# Patient Record
Sex: Female | Born: 1985 | Race: White | Hispanic: No | Marital: Married | Smoking: Never smoker
Health system: Southern US, Community
[De-identification: ages and names within clinical notes are randomized; demographics above are authoritative.]

## PROBLEM LIST (undated history)

## (undated) DIAGNOSIS — G4700 Insomnia, unspecified: Secondary | ICD-10-CM

## (undated) DIAGNOSIS — K219 Gastro-esophageal reflux disease without esophagitis: Secondary | ICD-10-CM

## (undated) DIAGNOSIS — N200 Calculus of kidney: Secondary | ICD-10-CM

## (undated) DIAGNOSIS — R87619 Unspecified abnormal cytological findings in specimens from cervix uteri: Secondary | ICD-10-CM

## (undated) HISTORY — PX: OTHER SURGICAL HISTORY: SHX169

## (undated) HISTORY — DX: Insomnia, unspecified: G47.00

## (undated) HISTORY — DX: Gastro-esophageal reflux disease without esophagitis: K21.9

## (undated) HISTORY — PX: TONSILLECTOMY: SUR1361

## (undated) HISTORY — DX: Calculus of kidney: N20.0

## (undated) HISTORY — DX: Unspecified abnormal cytological findings in specimens from cervix uteri: R87.619

---

## 2005-03-21 ENCOUNTER — Emergency Department (HOSPITAL_COMMUNITY): Admission: EM | Admit: 2005-03-21 | Discharge: 2005-03-21 | Payer: Self-pay | Admitting: Family Medicine

## 2010-06-12 ENCOUNTER — Emergency Department (HOSPITAL_COMMUNITY)
Admission: EM | Admit: 2010-06-12 | Discharge: 2010-06-13 | Disposition: A | Discharge status: Home / Self Care | Payer: BC Managed Care – PPO | Attending: Emergency Medicine | Admitting: Emergency Medicine

## 2010-06-12 DIAGNOSIS — N1330 Unspecified hydronephrosis: Secondary | ICD-10-CM | POA: Insufficient documentation

## 2010-06-12 DIAGNOSIS — N12 Tubulo-interstitial nephritis, not specified as acute or chronic: Secondary | ICD-10-CM | POA: Insufficient documentation

## 2010-06-12 DIAGNOSIS — R1110 Vomiting, unspecified: Secondary | ICD-10-CM | POA: Insufficient documentation

## 2010-06-12 DIAGNOSIS — R109 Unspecified abdominal pain: Secondary | ICD-10-CM | POA: Insufficient documentation

## 2010-06-12 DIAGNOSIS — N201 Calculus of ureter: Secondary | ICD-10-CM | POA: Insufficient documentation

## 2010-06-12 LAB — URINALYSIS, ROUTINE W REFLEX MICROSCOPIC
Nitrite: POSITIVE — AB
Protein, ur: NEGATIVE mg/dL
Urobilinogen, UA: 1 mg/dL (ref 0.0–1.0)

## 2010-06-12 LAB — URINE MICROSCOPIC-ADD ON

## 2010-06-13 ENCOUNTER — Emergency Department (HOSPITAL_COMMUNITY): Payer: BC Managed Care – PPO

## 2010-06-13 LAB — CBC
HCT: 39.8 % (ref 36.0–46.0)
MCV: 86.7 fL (ref 78.0–100.0)
RBC: 4.59 MIL/uL (ref 3.87–5.11)
RDW: 12.8 % (ref 11.5–15.5)
WBC: 10.3 10*3/uL (ref 4.0–10.5)

## 2010-06-13 LAB — DIFFERENTIAL
Eosinophils Relative: 0 % (ref 0–5)
Lymphocytes Relative: 11 % — ABNORMAL LOW (ref 12–46)
Lymphs Abs: 1.2 10*3/uL (ref 0.7–4.0)

## 2010-06-13 LAB — BASIC METABOLIC PANEL
BUN: 15 mg/dL (ref 6–23)
Chloride: 103 mEq/L (ref 96–112)
Glucose, Bld: 117 mg/dL — ABNORMAL HIGH (ref 70–99)
Potassium: 3.8 mEq/L (ref 3.5–5.1)

## 2010-06-14 LAB — URINE CULTURE
Colony Count: 25000
Culture  Setup Time: 201205261139

## 2010-11-16 LAB — HEPATIC FUNCTION PANEL: Bilirubin, Total: 0.4 mg/dL

## 2010-11-16 LAB — BASIC METABOLIC PANEL: Glucose: 76 mg/dL

## 2010-11-16 LAB — TSH: TSH: 2.25 u[IU]/mL (ref 0.41–5.90)

## 2010-11-25 ENCOUNTER — Other Ambulatory Visit: Payer: Self-pay | Admitting: Internal Medicine

## 2010-12-17 ENCOUNTER — Ambulatory Visit
Admission: RE | Admit: 2010-12-17 | Discharge: 2010-12-17 | Disposition: A | Discharge status: Home / Self Care | Payer: BC Managed Care – PPO | Source: Ambulatory Visit | Attending: Internal Medicine | Admitting: Internal Medicine

## 2011-02-08 ENCOUNTER — Encounter: Payer: Self-pay | Admitting: Family Medicine

## 2011-02-08 ENCOUNTER — Ambulatory Visit (INDEPENDENT_AMBULATORY_CARE_PROVIDER_SITE_OTHER): Payer: BC Managed Care – PPO

## 2011-02-08 DIAGNOSIS — G4700 Insomnia, unspecified: Secondary | ICD-10-CM | POA: Insufficient documentation

## 2011-02-08 DIAGNOSIS — N390 Urinary tract infection, site not specified: Secondary | ICD-10-CM

## 2011-04-27 ENCOUNTER — Ambulatory Visit (INDEPENDENT_AMBULATORY_CARE_PROVIDER_SITE_OTHER): Payer: BC Managed Care – PPO | Admitting: Physician Assistant

## 2011-04-27 VITALS — BP 122/74 | HR 93 | Temp 98.2°F | Resp 20 | Ht 60.0 in | Wt 138.0 lb

## 2011-04-27 DIAGNOSIS — R300 Dysuria: Secondary | ICD-10-CM

## 2011-04-27 DIAGNOSIS — N390 Urinary tract infection, site not specified: Secondary | ICD-10-CM

## 2011-04-27 LAB — POCT URINALYSIS DIPSTICK
Blood, UA: NEGATIVE
Glucose, UA: 250
Nitrite, UA: POSITIVE
Urobilinogen, UA: >=8

## 2011-04-27 LAB — POCT UA - MICROSCOPIC ONLY: Crystals, Ur, HPF, POC: NEGATIVE

## 2011-04-27 MED ORDER — PHENAZOPYRIDINE HCL 200 MG PO TABS: 200.0000 mg | ORAL_TABLET | Freq: Three times a day (TID) | ORAL | Status: DC | PRN

## 2011-04-27 MED ORDER — NITROFURANTOIN MONOHYD MACRO 100 MG PO CAPS: 100.0000 mg | ORAL_CAPSULE | Freq: Two times a day (BID) | ORAL | Status: AC

## 2011-04-27 NOTE — Progress Notes (Signed)
  Subjective:    Patient ID: Taylor Carey, female    DOB: 1985/05/08, 26 y.o.   MRN: [id number]  HPI Pt presents to clinic with 48h h/o dysuria and hematuria.  Was bad on Sunday then yesterday was a little improved.  Today return of symptoms.  Abd discomfort and slight nausea but no fever or back pain.  She started using pyridium and that helps.  She has h/o UTI esp after sexually intercourse.  She has no vaginal symptoms and no change in sex partners.     Review of Systems  Constitutional: Negative for fever and chills.  Gastrointestinal: Positive for nausea (mild) and abdominal pain (suprapubic TTP).  Genitourinary: Positive for dysuria, urgency, frequency and hematuria. Negative for vaginal bleeding, vaginal discharge and vaginal pain.       Objective:   Physical Exam  Constitutional: She appears well-developed and well-nourished.  HENT:  Head: Normocephalic and atraumatic.  Right Ear: External ear normal.  Left Ear: External ear normal.  Cardiovascular: Normal rate, regular rhythm and normal heart sounds.   No murmur heard. Pulmonary/Chest: Effort normal and breath sounds normal.  Abdominal: Soft. There is tenderness (suprapubic TTP). There is no CVA tenderness.  Skin: Skin is warm and dry.  Psychiatric: She has a normal mood and affect. Her behavior is normal. Judgment and thought content normal.      Results for orders placed in visit on 04/27/11  POCT URINALYSIS DIPSTICK      Component Value Range   Color, UA orange     Clarity, UA cloudy     Glucose, UA 250     Bilirubin, UA mod     Ketones, UA 15     Spec Grav, UA <=1.005     Blood, UA neg     pH, UA 5.0     Protein, UA 300     Urobilinogen, UA >=8.0     Nitrite, UA pos     Leukocytes, UA large (3+)    POCT UA - MICROSCOPIC ONLY      Component Value Range   WBC, Ur, HPF, POC 8-10     RBC, urine, microscopic 1-2     Bacteria, U Microscopic 2+     Mucus, UA neg     Epithelial cells, urine per micros 0-3       Crystals, Ur, HPF, POC neg     Casts, Ur, LPF, POC neg     Yeast, UA neg         Assessment & Plan:   1. Dysuria  POCT urinalysis dipstick, POCT UA - Microscopic Only, Urine culture  2. Urinary tract infection, site not specified  Urine culture, nitrofurantoin, macrocrystal-monohydrate, (MACROBID) 100 MG capsule   Pt to push fluids,  Take abx as directed.  F/u if continued problems.  Answered questions for patient.

## 2011-04-28 ENCOUNTER — Ambulatory Visit
Admission: RE | Admit: 2011-04-28 | Discharge: 2011-04-28 | Disposition: A | Discharge status: Home / Self Care | Payer: BC Managed Care – PPO | Source: Ambulatory Visit | Attending: Family Medicine | Admitting: Family Medicine

## 2011-04-28 ENCOUNTER — Telehealth: Payer: Self-pay

## 2011-04-28 ENCOUNTER — Ambulatory Visit (INDEPENDENT_AMBULATORY_CARE_PROVIDER_SITE_OTHER): Payer: BC Managed Care – PPO | Admitting: Family Medicine

## 2011-04-28 VITALS — BP 108/66 | HR 112 | Temp 98.5°F | Resp 16 | Ht 60.0 in | Wt 138.0 lb

## 2011-04-28 DIAGNOSIS — R109 Unspecified abdominal pain: Secondary | ICD-10-CM

## 2011-04-28 DIAGNOSIS — R300 Dysuria: Secondary | ICD-10-CM

## 2011-04-28 DIAGNOSIS — IMO0001 Reserved for inherently not codable concepts without codable children: Secondary | ICD-10-CM

## 2011-04-28 DIAGNOSIS — N390 Urinary tract infection, site not specified: Secondary | ICD-10-CM

## 2011-04-28 DIAGNOSIS — R5381 Other malaise: Secondary | ICD-10-CM

## 2011-04-28 DIAGNOSIS — R81 Glycosuria: Secondary | ICD-10-CM

## 2011-04-28 LAB — POCT CBC
HCT, POC: 37.8 % (ref 37.7–47.9)
Lymph, poc: 0.8 (ref 0.6–3.4)
MCH, POC: 28.1 pg (ref 27–31.2)
MCHC: 33.1 g/dL (ref 31.8–35.4)
MCV: 84.9 fL (ref 80–97)
MID (cbc): 0.7 (ref 0–0.9)
MPV: 9.8 fL (ref 0–99.8)
POC LYMPH PERCENT: 5.3 %L — AB (ref 10–50)
WBC: 14.5 10*3/uL — AB (ref 4.6–10.2)

## 2011-04-28 LAB — POCT URINALYSIS DIPSTICK
Protein, UA: 100
Spec Grav, UA: <=1.005
Urobilinogen, UA: 4
pH, UA: 5

## 2011-04-28 LAB — POCT WET PREP WITH KOH
KOH Prep POC: NEGATIVE
Trichomonas, UA: NEGATIVE
Yeast Wet Prep HPF POC: NEGATIVE

## 2011-04-28 LAB — POCT URINE PREGNANCY: Preg Test, Ur: NEGATIVE

## 2011-04-28 LAB — POCT UA - MICROSCOPIC ONLY
Casts, Ur, LPF, POC: NEGATIVE
Crystals, Ur, HPF, POC: NEGATIVE
Yeast, UA: NEGATIVE

## 2011-04-28 LAB — POCT INFLUENZA A/B
Influenza A, POC: NEGATIVE
Influenza B, POC: NEGATIVE

## 2011-04-28 LAB — GLUCOSE, POCT (MANUAL RESULT ENTRY): POC Glucose: 80

## 2011-04-28 MED ORDER — IOHEXOL 300 MG/ML  SOLN
100.0000 mL | Freq: Once | INTRAMUSCULAR | Status: AC | PRN
  Administered 2011-04-28: 100 mL via INTRAVENOUS

## 2011-04-28 MED ORDER — CEFTRIAXONE SODIUM 1 G IJ SOLR
1.0000 g | INTRAMUSCULAR | Status: DC
  Administered 2011-04-28: 1 g via INTRAMUSCULAR

## 2011-04-28 MED ORDER — HYDROCODONE-ACETAMINOPHEN 5-500 MG PO TABS: 1.0000 | ORAL_TABLET | Freq: Three times a day (TID) | ORAL | Status: AC | PRN

## 2011-04-28 NOTE — Telephone Encounter (Signed)
PT STATES DR COPLAND SENT HER OVER TO [facility] AND SHE WOULD LIKE TO SPEAK WITH SOMEONE TO LET THEM KNOW WHAT HAPPENED PLEASE CALL [phone]

## 2011-04-28 NOTE — Telephone Encounter (Signed)
Pt came in to see Dr. Patsy Lager

## 2011-04-28 NOTE — Progress Notes (Signed)
Patient Name: Taylor Carey Date of Birth: February 22, 1985 Medical Record Number: [id number] Gender: female Date of Encounter: 04/28/2011  History of Present Illness:  Taylor Carey is a 26 y.o. very pleasant female patient who presents with the following:  Here yesterday and started on macrobid for UTI symptoms; urinary frequency, burning with urination.  She noted bad lower back pain, nausea, felt feverish, cough, and headache last night.  Did not check her temperature.    She also notes a mild ST with cough, no earache.  Did have body aches, no chills. Positive for fatigue.  Taylor Carey states that the cough was worse last night but better now.   No vomiting, no diarrhea, no abdominal pain of any significance- she admits to some RLQ tenderness at times.   A little vaginal D/C.  Dysuria seems a little bit better now.  Did not eat yet this morning.   She has an OBG- she had her last pap in march of 2012.  She has been with her current BF for about one year and has had gonorrhea/ chlamydia testing in the last year- no other partner.  She does not suspect risk for STI.   Surgical history positive for T and A only LMP 04/18/11 Took excedrin migraine this morning She has not yet eaten today but feels that she might like to eat  She did have an obstructing kidney stone last year and hydronephrosis. This resolved but she had a CT scan then for diagnosis.  Patient Active Problem List  Diagnoses  . Insomnia   Past Medical History  Diagnosis Date  . Insomnia    No past surgical history on file. History  Substance Use Topics  . Smoking status: Current Some Day Smoker    Types: Cigarettes  . Smokeless tobacco: Not on file  . Alcohol Use: 1.0 oz/week    2 drink(s) per week   No family history on file. No Known Allergies  Medication list has been reviewed and updated.  Review of Systems: As per HPI- otherwise negative.   Physical Examination: Filed Vitals:   04/28/11 0842  BP:  108/66  Pulse: 112  Temp: 98.5 F (36.9 C)  TempSrc: Oral  Resp: 16  Height: 5' (1.524 m)  Weight: 138 lb (62.596 kg)    Body mass index is 26.95 kg/(m^2). GEN: WDWN, NAD, Non-toxic, A & O x 3 HEENT: Atraumatic, Normocephalic. Neck supple. No masses, No LAD.  Tm, oropharynx wnl, PEERL Ears and Nose: No external deformity. CV: RRR, No M/G/R. No JVD. No thrill. No extra heart sounds. PULM: CTA B, no wheezes, crackles, rhonchi. No retractions. No resp. distress. No accessory muscle use. ABD: S, ND, +BS. No HSM.  There is RLQ tenderness, guarding and some RLQ rebound.   GU: normal external genitalia, no CMT, no cervical discharge.  No adnexal masses- some tenderness RLQ EXTR: No c/c/e NEURO Normal gait.  PSYCH: Normally interactive. Conversant. Not depressed or anxious appearing.  Calm demeanor.   Results for orders placed in visit on 04/28/11  POCT URINALYSIS DIPSTICK      Component Value Range   Color, UA orange     Clarity, UA hazy     Glucose, UA 100     Bilirubin, UA small     Ketones, UA trace     Spec Grav, UA <=1.005     Blood, UA neg     pH, UA 5.0     Protein, UA 100     Urobilinogen, UA 4.0  Nitrite, UA pos     Leukocytes, UA large (3+)    POCT UA - MICROSCOPIC ONLY      Component Value Range   WBC, Ur, HPF, POC 1-2     RBC, urine, microscopic 0-2     Bacteria, U Microscopic neg     Mucus, UA neg     Epithelial cells, urine per micros 2-5     Crystals, Ur, HPF, POC neg     Casts, Ur, LPF, POC neg     Yeast, UA neg    POCT WET PREP WITH KOH      Component Value Range   Trichomonas, UA Negative     Clue Cells Wet Prep HPF POC 1-3     Epithelial Wet Prep HPF POC 4-8     Yeast Wet Prep HPF POC negative     Bacteria Wet Prep HPF POC 2+     RBC Wet Prep HPF POC negative     WBC Wet Prep HPF POC 2-4     KOH Prep POC Negative    POCT CBC      Component Value Range   WBC 14.5 (*) 4.6 - 10.2 (K/uL)   Lymph, poc 0.8  0.6 - 3.4    POC LYMPH PERCENT 5.3 (*)  10 - 50 (%L)   MID (cbc) 0.7  0 - 0.9    POC MID % 4.7  0 - 12 (%M)   POC Granulocyte 13.1 (*) 2 - 6.9    Granulocyte percent 90.0 (*) 37 - 80 (%G)   RBC 4.45  4.04 - 5.48 (M/uL)   Hemoglobin 12.5  12.2 - 16.2 (g/dL)   HCT, POC 08.6  57.8 - 47.9 (%)   MCV 84.9  80 - 97 (fL)   MCH, POC 28.1  27 - 31.2 (pg)   MCHC 33.1  31.8 - 35.4 (g/dL)   RDW, POC 46.9     Platelet Count, POC 239  142 - 424 (K/uL)   MPV 9.8  0 - 99.8 (fL)  POCT URINE PREGNANCY      Component Value Range   Preg Test, Ur Negative    POCT INFLUENZA A/B      Component Value Range   Influenza A, POC Negative     Influenza B, POC Negative    GLUCOSE, POCT (MANUAL RESULT ENTRY)      Component Value Range   POC Glucose 80     Suspect glucose in urine is actually false- due to pyridium use  Comparison: CT of abdomen and pelvis 06/13/2010.  Findings:  Lung Bases: Unremarkable.  Abdomen/Pelvis: The enhanced appearance of the liver, gallbladder, pancreas, spleen, bilateral adrenal glands and bilateral kidneys is unremarkable. There is no ascites or pneumoperitoneum and no pathologic distension of bowel. No definite pathologic adenopathy identified within the abdomen or pelvis. Uterus, bilateral ovaries and urinary bladder are unremarkable in appearance. The appendix is normal in appearance.  Musculoskeletal: There are no aggressive appearing lytic or blastic lesions noted in the visualized portions of the skeleton.  IMPRESSION: 1. No acute findings in the abdomen or pelvis to account for the patient's symptoms. Specifically, the appendix is normal.   Assessment and Plan: 1. UTI (lower urinary tract infection)  POCT urine pregnancy, cefTRIAXone (ROCEPHIN) injection 1 g  2. Flank pain  Urinalysis Dipstick, POCT UA - Microscopic Only, POCT Wet Prep with KOH, POCT CBC, HYDROcodone-acetaminophen (VICODIN) 5-500 MG per tablet  3. Malaise  Influenza A/B  4. Abdominal  pain,  other specified site  CT Abdomen Pelvis W  Contrast  5. Glycosuria  POCT glucose (manual entry)    Taylor Carey is here today to follow- up a UTI.  However, her symptoms today and elevated wbc count are concerning for appendicitis.  Discussed options in detail with Taylor Carey- we do not want to do any more CT scans than absolutely necessary, especially as she had one last year.  However, my concern for appendicitis is fairly high.  Offered the option to recheck in 24 hours- however Taylor Carey did decide to go ahead with the scan- results were negative as above.   Suspect pyelonephritis causing symptoms today.  Will give a shot of rocephin today and await urine culture which should be back by tomorrow.  Also gave vicodin for her back pain- this may be MSK and unrelated to other symptoms.  Let us know sooner if worse!  Addnd: called patient 04/29/11- urine culture shows e. Coli.  Add Cipro 500 BID for 7 days- called in to her pharmacy.  Will look for sensitivity tomorrow.  For now may continue macrobid and cipro.

## 2011-04-29 MED ORDER — CIPROFLOXACIN HCL 500 MG PO TABS: 500.0000 mg | ORAL_TABLET | Freq: Two times a day (BID) | ORAL | Status: AC

## 2011-04-29 NOTE — Progress Notes (Signed)
Called to check on her today- doing ok, feels a little better.  ucx still not back- will continue to look for this today and hope to have result by this pm

## 2011-04-30 ENCOUNTER — Telehealth: Payer: Self-pay | Admitting: *Deleted

## 2011-04-30 NOTE — Telephone Encounter (Signed)
Per dr copland,  pt's urine cx sensitivity is not back yet.  md would like [place] to check status.  Left message on machine for pt to call back.

## 2011-05-01 LAB — URINE CULTURE: Colony Count: >=100000

## 2011-05-01 NOTE — Telephone Encounter (Signed)
Pt states that she is doing better.

## 2011-05-01 NOTE — Telephone Encounter (Signed)
Got sensitivity back- e. Coli sensitive to cipro and macrobid.  LMOM- may finish out both macrobid and cipro as she is improving.  Let us know if any complication or if she gets worse.

## 2011-06-08 ENCOUNTER — Telehealth: Payer: Self-pay

## 2011-06-08 NOTE — Telephone Encounter (Signed)
Dr.Copland, Pt states that she would like to discuss her OV from April with you. No further details from the pt she only wants to speak with you*

## 2011-06-10 NOTE — Telephone Encounter (Signed)
[name] was calling to see if we could help with a billing issue from a recent CT scan.  We called [facility]- it seems she had not fulfilled her deductible, and this was the problem.  Called and let her know

## 2011-08-30 ENCOUNTER — Other Ambulatory Visit: Payer: Self-pay | Admitting: Radiology

## 2011-08-30 MED ORDER — LORAZEPAM 1 MG PO TABS: 1.0000 mg | ORAL_TABLET | Freq: Every evening | ORAL | Status: DC | PRN

## 2011-08-30 NOTE — Telephone Encounter (Signed)
Printed to fax to pharmacy 

## 2011-09-29 ENCOUNTER — Other Ambulatory Visit: Payer: Self-pay | Admitting: Family Medicine

## 2011-09-29 MED ORDER — LORAZEPAM 1 MG PO TABS: 1.0000 mg | ORAL_TABLET | Freq: Every evening | ORAL | Status: DC | PRN

## 2011-11-25 ENCOUNTER — Ambulatory Visit: Payer: BC Managed Care – PPO | Admitting: Family Medicine

## 2012-05-24 ENCOUNTER — Ambulatory Visit: Payer: BC Managed Care – PPO | Admitting: Physician Assistant

## 2012-05-24 VITALS — BP 117/74 | HR 105 | Temp 97.9°F | Resp 16 | Ht 60.0 in | Wt 125.0 lb

## 2012-05-24 DIAGNOSIS — N390 Urinary tract infection, site not specified: Secondary | ICD-10-CM

## 2012-05-24 DIAGNOSIS — R300 Dysuria: Secondary | ICD-10-CM

## 2012-05-24 LAB — POCT URINALYSIS DIPSTICK
Bilirubin, UA: NEGATIVE
Blood, UA: NEGATIVE
Glucose, UA: NEGATIVE
Nitrite, UA: POSITIVE
Protein, UA: NEGATIVE
Spec Grav, UA: 1.02
Urobilinogen, UA: 0.2
pH, UA: 6

## 2012-05-24 LAB — POCT UA - MICROSCOPIC ONLY
Casts, Ur, LPF, POC: NEGATIVE
Crystals, Ur, HPF, POC: NEGATIVE
Yeast, UA: NEGATIVE

## 2012-05-24 MED ORDER — PHENAZOPYRIDINE HCL 200 MG PO TABS: 200.0000 mg | ORAL_TABLET | Freq: Three times a day (TID) | ORAL | Status: DC | PRN

## 2012-05-24 MED ORDER — DESONIDE 0.05 % EX CREA: TOPICAL_CREAM | Freq: Two times a day (BID) | CUTANEOUS | Status: DC

## 2012-05-24 MED ORDER — CIPROFLOXACIN HCL 500 MG PO TABS: 500.0000 mg | ORAL_TABLET | Freq: Two times a day (BID) | ORAL | Status: DC

## 2012-05-24 NOTE — Progress Notes (Signed)
Subjective:    Patient ID: Taylor Carey, female    DOB: April 29, 1985, 27 y.o.   MRN: [id number]  HPI 27 year old female presents with acute onset of dysuria, urinary frequency, hematuria, and slight nausea.  Denies fever, chills, vomiting, flank pain, or abdominal pain.  She does have a strong history of frequent UTI's, last episode was 3-4 months ago.  She has a prescription from her GYN to take a pill when she first notices symptoms (does not remember the name of this). She did take 1 tablet last night but it did not help.    Also complains of pruritic rash on bilateral eyelids.  Has been noticing for about 7 months - first started after trying a new eye shadow.  She dc'ed this eye shadow and symptoms did not seem to improve. Does wear a lot of eyeliner and eye makeup.  Admits that if she does not wear make up for a few days, it seems to improve.  Has been using OTC hydrocortisone and apple cider vinegar which does seem to help calm it down.      Review of Systems  Constitutional: Negative for fever and chills.  Eyes: Positive for itching (eyelids).  Gastrointestinal: Positive for nausea. Negative for vomiting and abdominal pain.  Genitourinary: Positive for dysuria and frequency. Negative for flank pain and difficulty urinating.       Objective:   Physical Exam  Constitutional: She is oriented to person, place, and time. She appears well-developed and well-nourished.  HENT:  Head: Normocephalic and atraumatic.  Right Ear: External ear normal.  Left Ear: External ear normal.  Mouth/Throat: Oropharynx is clear and moist.  Eyes: Conjunctivae and lids are normal.  Neck: Normal range of motion.  Cardiovascular: Normal rate, regular rhythm and normal heart sounds.   Pulmonary/Chest: Effort normal and breath sounds normal.  Abdominal: Soft. Bowel sounds are normal. There is no tenderness. There is no rebound, no guarding and no CVA tenderness.  Neurological: She is alert and oriented to  person, place, and time.  Psychiatric: She has a normal mood and affect. Her behavior is normal. Judgment and thought content normal.     Results for orders placed in visit on 05/24/12  POCT URINALYSIS DIPSTICK      Result Value Range   Color, UA amber     Clarity, UA hazy     Glucose, UA neg     Bilirubin, UA neg     Ketones, UA trace     Spec Grav, UA 1.020     Blood, UA neg     pH, UA 6.0     Protein, UA neg     Urobilinogen, UA 0.2     Nitrite, UA positive     Leukocytes, UA Trace    POCT UA - MICROSCOPIC ONLY      Result Value Range   WBC, Ur, HPF, POC 8-12     RBC, urine, microscopic 1-3     Bacteria, U Microscopic trace     Mucus, UA trace     Epithelial cells, urine per micros 1-3     Crystals, Ur, HPF, POC neg     Casts, Ur, LPF, POC neg     Yeast, UA neg          Assessment & Plan:  UTI (urinary tract infection)  Dysuria - Plan: POCT urinalysis dipstick, POCT UA - Microscopic Only, desonide (DESOWEN) 0.05 % cream, ciprofloxacin (CIPRO) 500 MG tablet, Urine culture, phenazopyridine (PYRIDIUM) 200  MG tablet  Start Cipro 500 mg bid x 5 days Urine culture sent Pyridium 200 mg tid prn dysuria and frequency Increase fluids and rest For eyelid symptoms, recommend changing eye makeup brands - likely allergic etiology. Patient unwilling to dc makeup altogether Follow up if symptoms worsen or fail to improve.

## 2012-05-25 ENCOUNTER — Encounter: Payer: Self-pay | Admitting: Certified Nurse Midwife

## 2012-05-26 ENCOUNTER — Ambulatory Visit (INDEPENDENT_AMBULATORY_CARE_PROVIDER_SITE_OTHER): Payer: BC Managed Care – PPO | Admitting: Certified Nurse Midwife

## 2012-05-26 ENCOUNTER — Encounter: Payer: Self-pay | Admitting: Certified Nurse Midwife

## 2012-05-26 VITALS — BP 90/64 | Ht 60.25 in | Wt 127.0 lb

## 2012-05-26 DIAGNOSIS — IMO0001 Reserved for inherently not codable concepts without codable children: Secondary | ICD-10-CM

## 2012-05-26 DIAGNOSIS — Z01419 Encounter for gynecological examination (general) (routine) without abnormal findings: Secondary | ICD-10-CM

## 2012-05-26 DIAGNOSIS — Z23 Encounter for immunization: Secondary | ICD-10-CM

## 2012-05-26 DIAGNOSIS — Z309 Encounter for contraceptive management, unspecified: Secondary | ICD-10-CM

## 2012-05-26 LAB — URINE CULTURE
Colony Count: NO GROWTH
Organism ID, Bacteria: NO GROWTH

## 2012-05-26 MED ORDER — DROSPIRENONE-ETHINYL ESTRADIOL 3-0.02 MG PO TABS
1.0000 | ORAL_TABLET | Freq: Every day | ORAL | Status: DC
Start: 2012-05-26 — End: 2013-06-10

## 2012-05-26 MED ORDER — TETANUS-DIPHTH-ACELL PERTUSSIS 5-2.5-18.5 LF-MCG/0.5 IM SUSP
0.5000 mL | Freq: Once | INTRAMUSCULAR | Status: AC
  Administered 2012-05-26: 0.5 mL via INTRAMUSCULAR

## 2012-05-26 NOTE — Progress Notes (Signed)
27 y.o. G0P0000 Legally Separated Caucasian Fe here for annual exam. Periods normal, no issues.  OCP working well, no problems.  No STD concerns or testing needed. Currently on Cipro for UTI with PCP.  Symptoms much better.  Patient's last menstrual period was 05/14/2012.          Sexually active: yes  The current method of family planning is OCP (estrogen/progesterone).    Exercising: yes  cardio & weights Smoker:  no  Health Maintenance: Pap:  04-09-10 neg MMG:  none Colonoscopy:  none BMD:   none TDaP:  2004  Labs: none   reports that she has never smoked. She does not have any smokeless tobacco history on file. She reports that she drinks about 0.5 ounces of alcohol per week. She reports that she does not use illicit drugs.  Past Medical History  Diagnosis Date  . Insomnia   . Kidney stone     Past Surgical History  Procedure Laterality Date  . Tonsillectomy      Current Outpatient Prescriptions  Medication Sig Dispense Refill  . ciprofloxacin (CIPRO) 500 MG tablet Take 500 mg by mouth 2 (two) times daily.      Marland Kitchen desonide (DESOWEN) 0.05 % cream Apply topically 2 (two) times daily.  30 g  0  . drospirenone-ethinyl estradiol (YAZ,GIANVI,LORYNA) 3-0.02 MG tablet Take 1 tablet by mouth daily.      Marland Kitchen LORazepam (ATIVAN) 1 MG tablet Take 1 tablet (1 mg total) by mouth at bedtime as needed.  30 tablet  0  . phenazopyridine (PYRIDIUM) 200 MG tablet Take 1 tablet (200 mg total) by mouth 3 (three) times daily as needed for pain.  10 tablet  0   No current facility-administered medications for this visit.    Family History  Problem Relation Age of Onset  . Crohn's disease Mother   . Hypertension Mother   . Cancer Father     kidney    ROS:  Pertinent items are noted in HPI.  Otherwise, a comprehensive ROS was negative.  Exam:   BP 90/64  Ht 5' 0.25" (1.53 m)  Wt 127 lb (57.607 kg)  BMI 24.61 kg/m2  LMP 05/14/2012 Height: 5' 0.25" (153 cm)  Ht Readings from Last 3  Encounters:  05/26/12 5' 0.25" (1.53 m)  05/24/12 5' (1.524 m)  04/28/11 5' (1.524 m)    General appearance: alert, cooperative and appears stated age Head: Normocephalic, without obvious abnormality, atraumatic Neck: no adenopathy, supple, symmetrical, trachea midline and thyroid normal to inspection and palpation Lungs: clear to auscultation bilaterally Breasts: normal appearance, no masses or tenderness, No nipple discharge or bleeding Heart: regular rate and rhythm Abdomen: soft, non-tender; no masses,  no organomegaly Extremities: extremities normal, atraumatic, no cyanosis or edema Skin: Skin color, texture, turgor normal. No rashes or lesions Lymph nodes: Cervical, supraclavicular, and axillary nodes normal. No abnormal inguinal nodes palpated Neurologic: Grossly normal   Pelvic: External genitalia:  no lesions              Urethra:  normal appearing urethra with no masses, tenderness or lesions              Bartholin's and Skene's: normal                 Vagina: normal appearing vagina with normal color and discharge, no lesions              Cervix: normal  Pap taken: no Bimanual Exam:  Uterus:  normal size, contour, position, consistency, mobility, non-tender and anteverted              Adnexa: normal adnexa and no mass, fullness, tenderness               Rectovaginal: Confirms               Anus:  normal sphincter tone, no lesions  A:  Well Woman with normal exam  Contraception OCP  UTI currently under treatment with PCP  Immunization update  P:  Reviewed health and wellness pertinent to exam  Pap smear as per guidelines   pap smear not collected today  RX Yaz see order Continue follow up as indicated with UTI  Requests TDAP  counseled on breast self exam, feminine hygiene, adequate intake of calcium and vitamin D return annually or prn  An After Visit Summary was printed and given to the patient.  Reviewed, TL

## 2012-05-26 NOTE — Patient Instructions (Addendum)
General topics  Next pap or exam is  due in 1 year Take a Women's multivitamin Take 1200 mg. of calcium daily - prefer dietary If any concerns in interim to call back  Breast Self-Awareness Practicing breast self-awareness may pick up problems early, prevent significant medical complications, and possibly save your life. By practicing breast self-awareness, you can become familiar with how your breasts look and feel and if your breasts are changing. This allows you to notice changes early. It can also offer you some reassurance that your breast health is good. One way to learn what is normal for your breasts and whether your breasts are changing is to do a breast self-exam. If you find a lump or something that was not present in the past, it is best to contact your caregiver right away. Other findings that should be evaluated by your caregiver include nipple discharge, especially if it is bloody; skin changes or reddening; areas where the skin seems to be pulled in (retracted); or new lumps and bumps. Breast pain is seldom associated with cancer (malignancy), but should also be evaluated by a caregiver. BREAST SELF-EXAM The best time to examine your breasts is 5 7 days after your menstrual period is over.  ExitCare Patient Information 2013 [employer].   Exercise to Stay Healthy Exercise helps you become and stay healthy. EXERCISE IDEAS AND TIPS Choose exercises that:  You enjoy.  Fit into your day. You do not need to exercise really hard to be healthy. You can do exercises at a slow or medium level and stay healthy. You can:  Stretch before and after working out.  Try yoga, Pilates, or tai chi.  Lift weights.  Walk fast, swim, jog, run, climb stairs, bicycle, dance, or rollerskate.  Take aerobic classes. Exercises that burn about 150 calories:  Running 1  miles in 15 minutes.  Playing volleyball for 45 to 60 minutes.  Washing and waxing a car for 45 to 60  minutes.  Playing touch football for 45 minutes.  Walking 1  miles in 35 minutes.  Pushing a stroller 1  miles in 30 minutes.  Playing basketball for 30 minutes.  Raking leaves for 30 minutes.  Bicycling 5 miles in 30 minutes.  Walking 2 miles in 30 minutes.  Dancing for 30 minutes.  Shoveling snow for 15 minutes.  Swimming laps for 20 minutes.  Walking up stairs for 15 minutes.  Bicycling 4 miles in 15 minutes.  Gardening for 30 to 45 minutes.  Jumping rope for 15 minutes.  Washing windows or floors for 45 to 60 minutes. Document Released: 02/06/2010 Document Revised: 03/29/2011 Document Reviewed: 02/06/2010 ExitCare Patient Information 2013 [employer].   Other topics ( that may be useful information):    Sexually Transmitted Disease Sexually transmitted disease (STD) refers to any infection that is passed from person to person during sexual activity. This may happen by way of saliva, semen, blood, vaginal mucus, or urine. Common STDs include:  Gonorrhea.  Chlamydia.  Syphilis.  HIV/AIDS.  Genital herpes.  Hepatitis B and C.  Trichomonas.  Human papillomavirus (HPV).  Pubic lice. CAUSES  An STD may be spread by bacteria, virus, or parasite. A person can get an STD by:  Sexual intercourse with an infected person.  Sharing sex toys with an infected person.  Sharing needles with an infected person.  Having intimate contact with the genitals, mouth, or rectal areas of an infected person. SYMPTOMS  Some people may not have any symptoms, but   they can still pass the infection to others. Different STDs have different symptoms. Symptoms include:  Painful or bloody urination.  Pain in the pelvis, abdomen, vagina, anus, throat, or eyes.  Skin rash, itching, irritation, growths, or sores (lesions). These usually occur in the genital or anal area.  Abnormal vaginal discharge.  Penile discharge in men.  Soft, flesh-colored skin growths in the  genital or anal area.  Fever.  Pain or bleeding during sexual intercourse.  Swollen glands in the groin area.  Yellow skin and eyes (jaundice). This is seen with hepatitis. DIAGNOSIS  To make a diagnosis, your caregiver may:  Take a medical history.  Perform a physical exam.  Take a specimen (culture) to be examined.  Examine a sample of discharge under a microscope.  Perform blood test TREATMENT   Chlamydia, gonorrhea, trichomonas, and syphilis can be cured with antibiotic medicine.  Genital herpes, hepatitis, and HIV can be treated, but not cured, with prescribed medicines. The medicines will lessen the symptoms.  Genital warts from HPV can be treated with medicine or by freezing, burning (electrocautery), or surgery. Warts may come back.  HPV is a virus and cannot be cured with medicine or surgery.However, abnormal areas may be followed very closely by your caregiver and may be removed from the cervix, vagina, or vulva through office procedures or surgery. If your diagnosis is confirmed, your recent sexual partners need treatment. This is true even if they are symptom-free or have a negative culture or evaluation. They should not have sex until their caregiver says it is okay. HOME CARE INSTRUCTIONS  All sexual partners should be informed, tested, and treated for all STDs.  Take your antibiotics as directed. Finish them even if you start to feel better.  Only take over-the-counter or prescription medicines for pain, discomfort, or fever as directed by your caregiver.  Rest.  Eat a balanced diet and drink enough fluids to keep your urine clear or pale yellow.  Do not have sex until treatment is completed and you have followed up with your caregiver. STDs should be checked after treatment.  Keep all follow-up appointments, Pap tests, and blood tests as directed by your caregiver.  Only use latex condoms and water-soluble lubricants during sexual activity. Do not use  petroleum jelly or oils.  Avoid alcohol and illegal drugs.  Get vaccinated for HPV and hepatitis. If you have not received these vaccines in the past, talk to your caregiver about whether one or both might be right for you.  Avoid risky sex practices that can break the skin. The only way to avoid getting an STD is to avoid all sexual activity.Latex condoms and dental dams (for oral sex) will help lessen the risk of getting an STD, but will not completely eliminate the risk. SEEK MEDICAL CARE IF:   You have a fever.  You have any new or worsening symptoms. Document Released: 03/27/2002 Document Revised: 03/29/2011 Document Reviewed: 04/03/2010 ExitCare Patient Information 2013 [employer].    Domestic Abuse You are being battered or abused if someone close to you hits, pushes, or physically hurts you in any way. You also are being abused if you are forced into activities. You are being sexually abused if you are forced to have sexual contact of any kind. You are being emotionally abused if you are made to feel worthless or if you are constantly threatened. It is important to remember that help is available. No one has the right to abuse you. PREVENTION OF FURTHER   ABUSE  Learn the warning signs of danger. This varies with situations but may include: the use of alcohol, threats, isolation from friends and family, or forced sexual contact. Leave if you feel that violence is going to occur.  If you are attacked or beaten, report it to the police so the abuse is documented. You do not have to press charges. The police can protect you while you or the attackers are leaving. Get the officer's name and badge number and a copy of the report.  Find someone you can trust and tell them what is happening to you: your caregiver, a nurse, clergy member, close friend or family member. Feeling ashamed is natural, but remember that you have done nothing wrong. No one deserves abuse. Document Released:  01/02/2000 Document Revised: 03/29/2011 Document Reviewed: 03/12/2010 ExitCare Patient Information 2013 [employer].    How Much is Too Much Alcohol? Drinking too much alcohol can cause injury, accidents, and health problems. These types of problems can include:   Car crashes.  Falls.  Family fighting (domestic violence).  Drowning.  Fights.  Injuries.  Burns.  Damage to certain organs.  Having a baby with birth defects. ONE DRINK CAN BE TOO MUCH WHEN YOU ARE:  Working.  Pregnant or breastfeeding.  Taking medicines. Ask your doctor.  Driving or planning to drive. If you or someone you know has a drinking problem, get help from a doctor.  Document Released: 10/31/2008 Document Revised: 03/29/2011 Document Reviewed: 10/31/2008 ExitCare Patient Information 2013 [employer].   Smoking Hazards Smoking cigarettes is extremely bad for your health. Tobacco smoke has over 200 known poisons in it. There are over 60 chemicals in tobacco smoke that cause cancer. Some of the chemicals found in cigarette smoke include:   Cyanide.  Benzene.  Formaldehyde.  Methanol (wood alcohol).  Acetylene (fuel used in welding torches).  Ammonia. Cigarette smoke also contains the poisonous gases nitrogen oxide and carbon monoxide.  Cigarette smokers have an increased risk of many serious medical problems and Smoking causes approximately:  90% of all lung cancer deaths in men.  80% of all lung cancer deaths in women.  90% of deaths from chronic obstructive lung disease. Compared with nonsmokers, smoking increases the risk of:  Coronary heart disease by 2 to 4 times.  Stroke by 2 to 4 times.  Men developing lung cancer by 23 times.  Women developing lung cancer by 13 times.  Dying from chronic obstructive lung diseases by 12 times.  . Smoking is the most preventable cause of death and disease in our society.  WHY IS SMOKING ADDICTIVE?  Nicotine is the chemical  agent in tobacco that is capable of causing addiction or dependence.  When you smoke and inhale, nicotine is absorbed rapidly into the bloodstream through your lungs. Nicotine absorbed through the lungs is capable of creating a powerful addiction. Both inhaled and non-inhaled nicotine may be addictive.  Addiction studies of cigarettes and spit tobacco show that addiction to nicotine occurs mainly during the teen years, when young people begin using tobacco products. WHAT ARE THE BENEFITS OF QUITTING?  There are many health benefits to quitting smoking.   Likelihood of developing cancer and heart disease decreases. Health improvements are seen almost immediately.  Blood pressure, pulse rate, and breathing patterns start returning to normal soon after quitting. QUITTING SMOKING   American Lung Association - 1-800-LUNGUSA  American Cancer Society - 1-800-ACS-2345 Document Released: 02/12/2004 Document Revised: 03/29/2011 Document Reviewed: 10/16/2008 ExitCare Patient Information 2013 [employer].   Stress Management Stress is a state of physical or mental tension that often results from changes in your life or normal routine. Some common causes of stress are:  Death of a loved one.  Injuries or severe illnesses.  Getting fired or changing jobs.  Moving into a new home. Other causes may be:  Sexual problems.  Business or financial losses.  Taking on a large debt.  Regular conflict with someone at home or at work.  Constant tiredness from lack of sleep. It is not just bad things that are stressful. It may be stressful to:  Win the lottery.  Get married.  Buy a new car. The amount of stress that can be easily tolerated varies from person to person. Changes generally cause stress, regardless of the types of change. Too much stress can affect your health. It may lead to physical or emotional problems. Too little stress (boredom) may also become stressful. SUGGESTIONS TO  REDUCE STRESS:  Talk things over with your family and friends. It often is helpful to share your concerns and worries. If you feel your problem is serious, you may want to get help from a professional counselor.  Consider your problems one at a time instead of lumping them all together. Trying to take care of everything at once may seem impossible. List all the things you need to do and then start with the most important one. Set a goal to accomplish 2 or 3 things each day. If you expect to do too many in a single day you will naturally fail, causing you to feel even more stressed.  Do not use alcohol or drugs to relieve stress. Although you may feel better for a short time, they do not remove the problems that caused the stress. They can also be habit forming.  Exercise regularly - at least 3 times per week. Physical exercise can help to relieve that "uptight" feeling and will relax you.  The shortest distance between despair and hope is often a good night's sleep.  Go to bed and get up on time allowing yourself time for appointments without being rushed.  Take a short "time-out" period from any stressful situation that occurs during the day. Close your eyes and take some deep breaths. Starting with the muscles in your face, tense them, hold it for a few seconds, then relax. Repeat this with the muscles in your neck, shoulders, hand, stomach, back and legs.  Take good care of yourself. Eat a balanced diet and get plenty of rest.  Schedule time for having fun. Take a break from your daily routine to relax. HOME CARE INSTRUCTIONS   Call if you feel overwhelmed by your problems and feel you can no longer manage them on your own.  Return immediately if you feel like hurting yourself or someone else. Document Released: 06/30/2000 Document Revised: 03/29/2011 Document Reviewed: 02/20/2007 ExitCare Patient Information 2013 [employer].   

## 2012-08-03 ENCOUNTER — Ambulatory Visit: Payer: BC Managed Care – PPO | Admitting: Family Medicine

## 2012-08-03 ENCOUNTER — Encounter: Payer: Self-pay | Admitting: Family Medicine

## 2012-08-03 VITALS — BP 116/68 | HR 81 | Temp 98.2°F | Resp 16 | Ht 60.5 in | Wt 130.4 lb

## 2012-08-03 DIAGNOSIS — Z3041 Encounter for surveillance of contraceptive pills: Secondary | ICD-10-CM | POA: Insufficient documentation

## 2012-08-03 DIAGNOSIS — L723 Sebaceous cyst: Secondary | ICD-10-CM

## 2012-08-03 MED ORDER — DOXYCYCLINE HYCLATE 50 MG PO CAPS: 50.0000 mg | ORAL_CAPSULE | Freq: Two times a day (BID) | ORAL | Status: DC

## 2012-08-03 NOTE — Patient Instructions (Addendum)

## 2012-08-03 NOTE — Progress Notes (Signed)
S:  This 27 y.o. Cauc female is here w/ here mother for eval of scalp lesions; these have been present for < 6 months and are not painful. Pt feels well otherwise. Her father has similar lesions on his scalp; diagnosis- sebaceous cysts.  Pt denies fever/ chills, abnormal weight loss, fatigue, enlarged lymph nodes, problems w/ scalp infections, or other skin lesions or rashes.  PMHx, Soc hx and Fam Hx reviewed.   ROS: AS per HPI; otherwise unremarkable.  O: Filed Vitals:   08/03/12 1516  BP: 116/68  Pulse: 81  Temp: 98.2 F (36.8 C)  Resp: 16   GEN: In NAD: WN,WD. HENT: [place]/AT; EOMI w/ clear conj/sclerae. EACs/nose/oroph unremarkable. NECK: Supple; no LAN. SKIN: Hair and scalp appear healthy. Scalp- 3 firm NT subcutaneous 4-8 mm nodules. No redness or active drainage. No areas of alopecia, redness or flakiness. NEURO: A&O x 3; CNs intact. Nonfocal.  A/P: Sebaceous cyst- Advised vigorous scalp massage during shampooing to help stimulate sebaceous gland activity and improve scalp circulation.                               RX: Doxycycline 50 mg 1 capsule bid for 2 weeks.

## 2012-12-04 ENCOUNTER — Ambulatory Visit: Payer: BC Managed Care – PPO | Admitting: Internal Medicine

## 2012-12-04 ENCOUNTER — Telehealth: Payer: Self-pay

## 2012-12-04 VITALS — BP 120/66 | HR 69 | Temp 98.9°F | Resp 18 | Ht 60.0 in | Wt 135.2 lb

## 2012-12-04 DIAGNOSIS — R300 Dysuria: Secondary | ICD-10-CM

## 2012-12-04 DIAGNOSIS — R350 Frequency of micturition: Secondary | ICD-10-CM

## 2012-12-04 LAB — POCT URINALYSIS DIPSTICK
Bilirubin, UA: NEGATIVE
Ketones, UA: NEGATIVE
Nitrite, UA: NEGATIVE
pH, UA: 6.5

## 2012-12-04 LAB — POCT UA - MICROSCOPIC ONLY
Casts, Ur, LPF, POC: NEGATIVE
Yeast, UA: NEGATIVE

## 2012-12-04 IMAGING — CT CT ABD-PELV W/O CM
2 of 4 series · 17 of 46 positions shown, 19 images · non-contrast
Comparison: None.

CLINICAL DATA: Right flank pain, nausea and vomiting.

CT ABDOMEN AND PELVIS WITHOUT CONTRAST
TECHNIQUE: Multidetector CT imaging of the abdomen and pelvis was
performed following the standard protocol without intravenous
contrast.

[Series 2: a/p w/o 5.0 b31f st · axial · non-contrast · 0.70mm/px · z∈[-639,-254]mm · 14 of 85 slices shown, 16 images]
[im 4/85  soft-tissue]
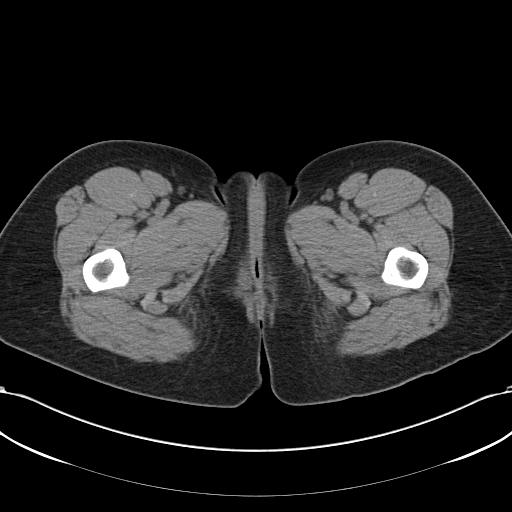
[im 4/85  bone]
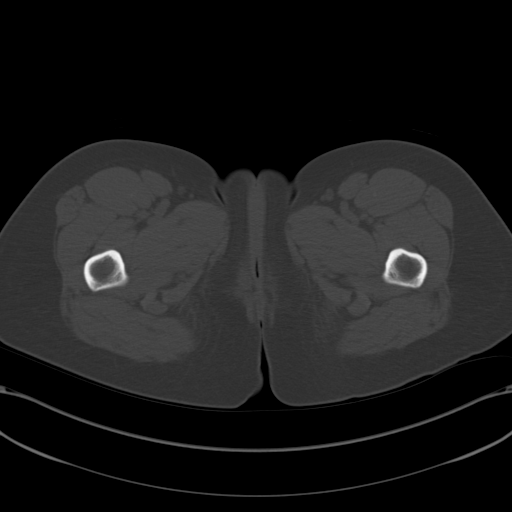
[im 11/85  soft-tissue]
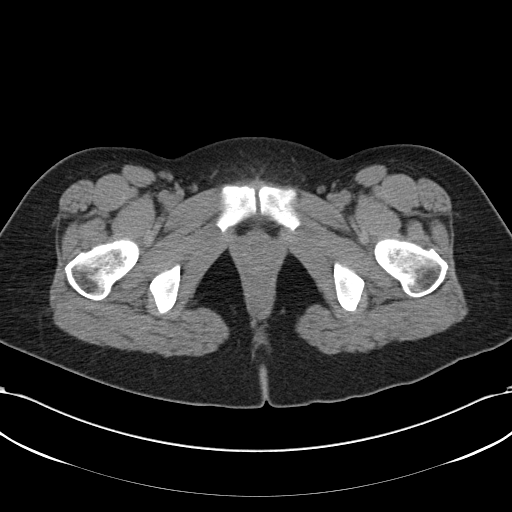
[im 18/85  soft-tissue]
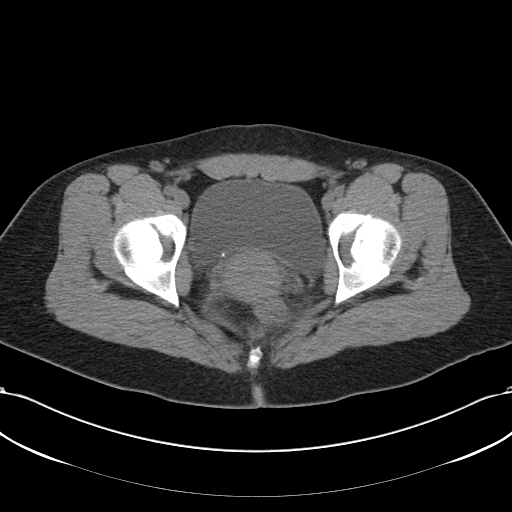
[im 22/85  soft-tissue]
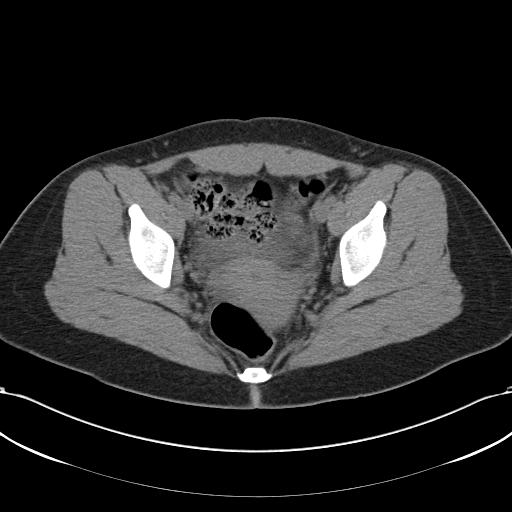
[im 29/85  soft-tissue]
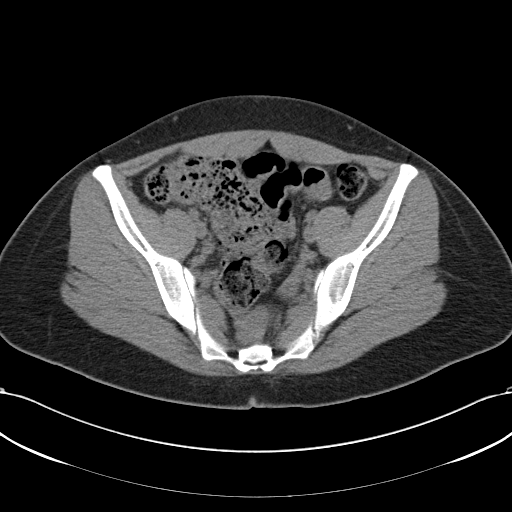
[im 36/85  soft-tissue]
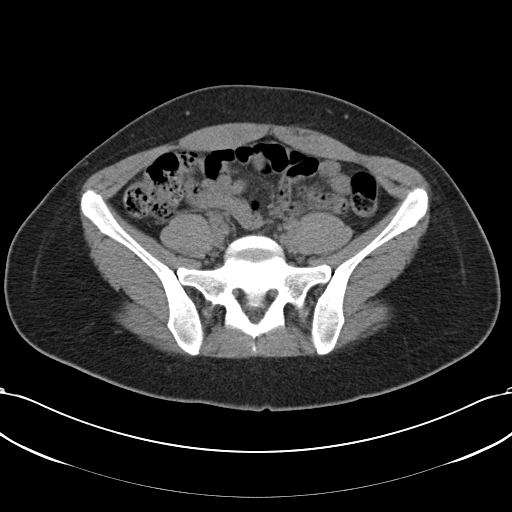
[im 39/85  soft-tissue]
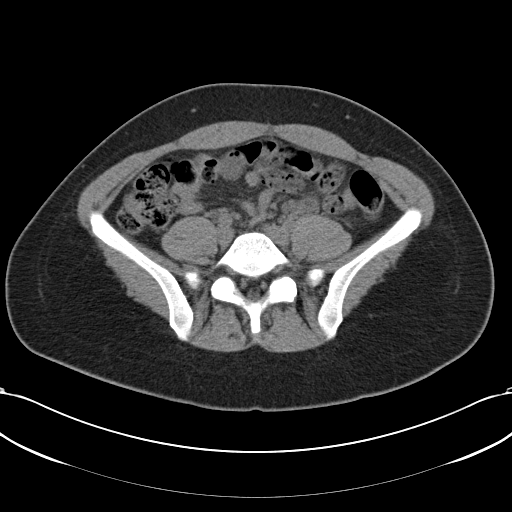
[im 46/85  soft-tissue]
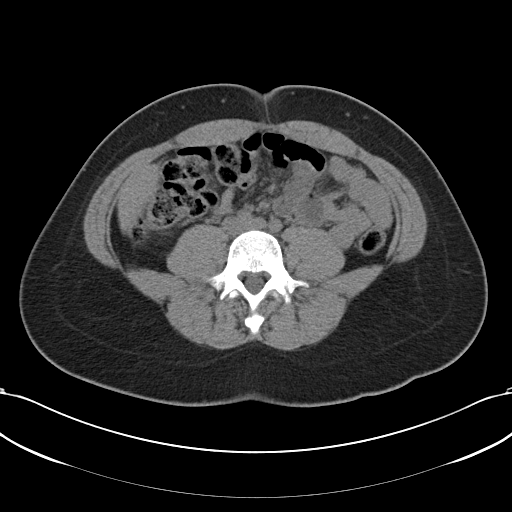
[im 50/85  soft-tissue]
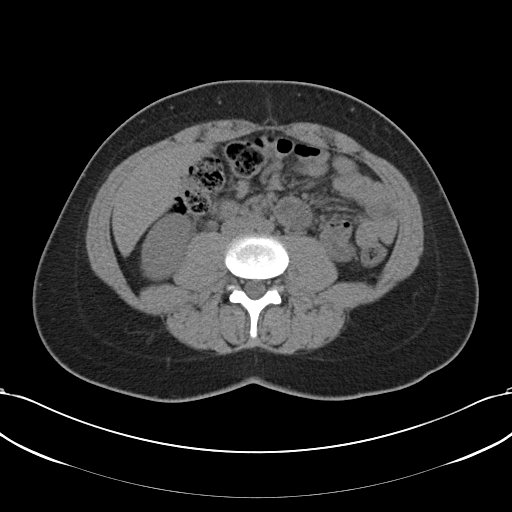
[im 50/85  bone]
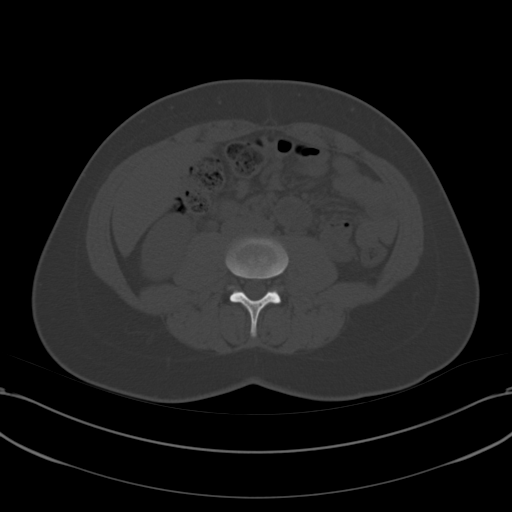
[im 57/85  soft-tissue]
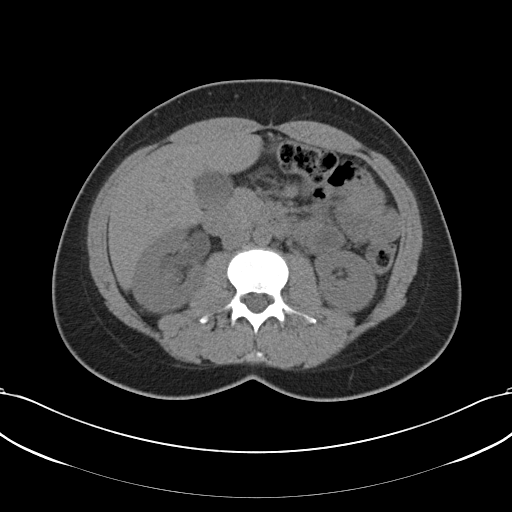
[im 64/85  soft-tissue]
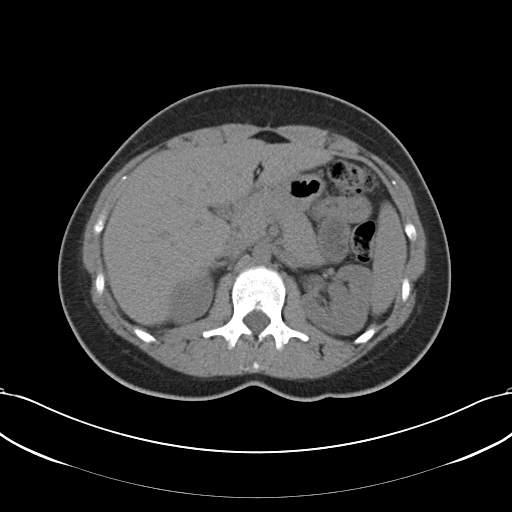
[im 67/85  soft-tissue]
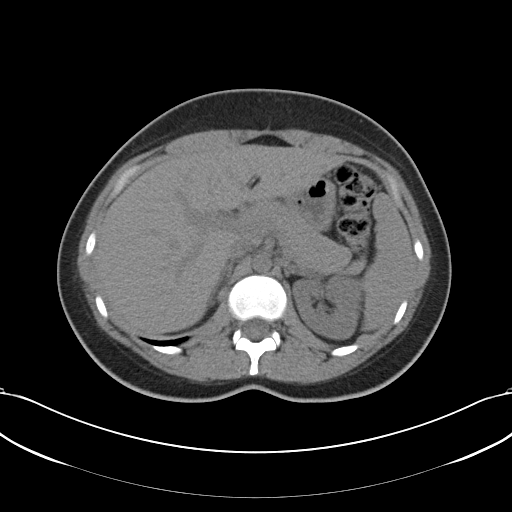
[im 74/85  soft-tissue]
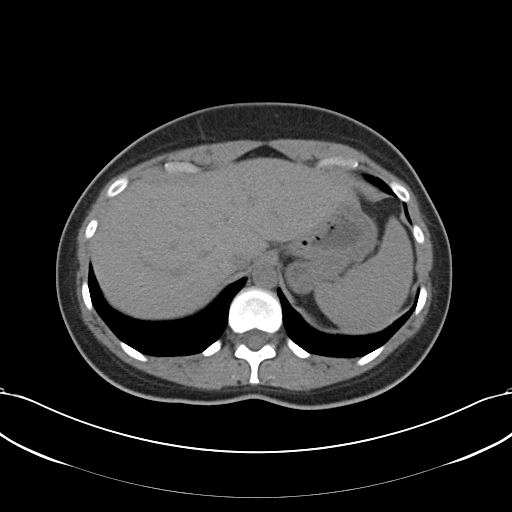
[im 81/85  soft-tissue]
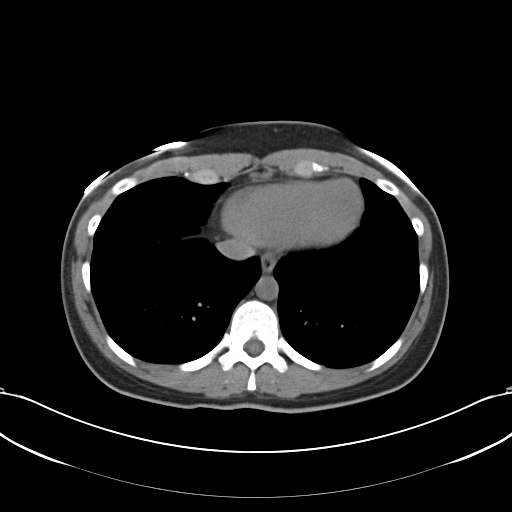

[Series 602: coronal · coronal · 0.83mm/px · 3 of 70 slices shown]
[im 24/70  soft-tissue]
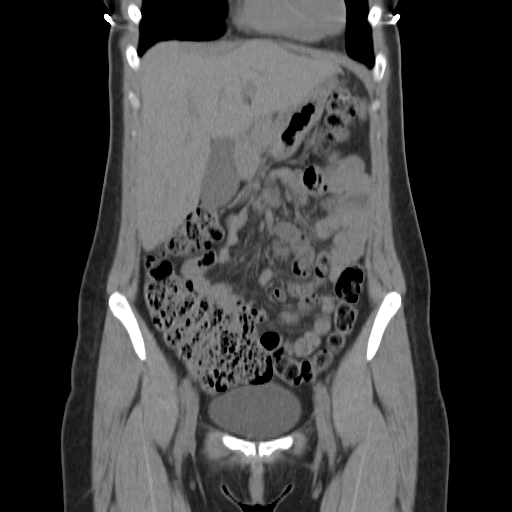
[im 31/70  soft-tissue]
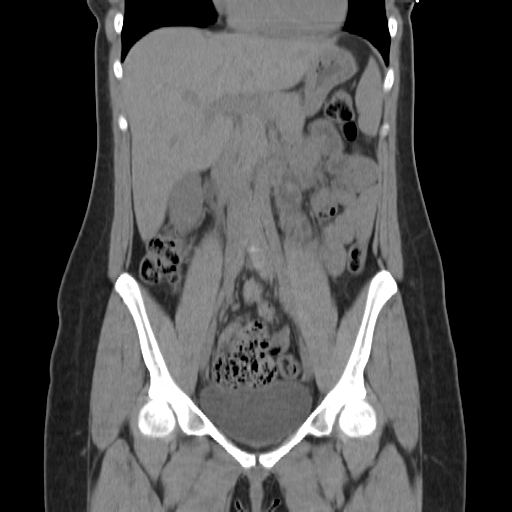
[im 39/70  soft-tissue]
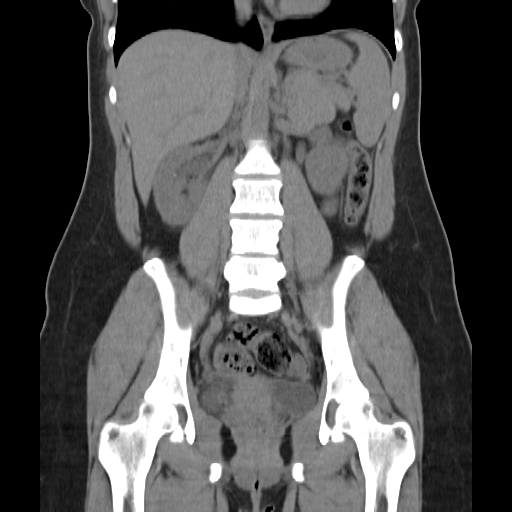

[17 of 46 positions shown; findings below may reference images not displayed]

FINDINGS: The visualized lung bases are clear.

The liver and spleen are unremarkable in appearance.  The
gallbladder is within normal limits.  The pancreas and adrenal
glands are unremarkable.

There is mild right-sided hydronephrosis, with mild distension of
the right ureter to the level of an obstructing stone at the right
vesicoureteral junction, measuring approximately 4 x 3 mm in size.
No significant perinephric stranding is seen.  There is also a
nonobstructing 3 mm stone noted near the upper pole of the right
kidney.  The left kidney is unremarkable in appearance.

No free fluid is identified.  The small bowel is unremarkable in
appearance.  The stomach is within normal limits.  No acute
vascular abnormalities are seen.

The appendix is normal in caliber and contains air, without
evidence for appendicitis.  The cecum is noted within the right
hemipelvis.  The colon is partially filled with stool, and is
unremarkable in appearance.

The bladder is mildly distended and otherwise unremarkable in
appearance.  The uterus is within normal limits.  The ovaries are
relatively symmetric; no suspicious adnexal masses are seen.  No
inguinal lymphadenopathy is seen.

No acute osseous abnormalities are identified.
IMPRESSION: 1.  Mild right-sided hydronephrosis, with an obstructing 4 x 3 mm
stone distally at the right vesicoureteral junction.
2.  Nonobstructing 3 mm stone noted near the upper pole of the
right kidney.

## 2012-12-04 MED ORDER — CIPROFLOXACIN HCL 250 MG PO TABS: 250.0000 mg | ORAL_TABLET | Freq: Two times a day (BID) | ORAL | Status: DC

## 2012-12-04 MED ORDER — SULFAMETHOXAZOLE-TMP DS 800-160 MG PO TABS: ORAL_TABLET | ORAL | Status: DC

## 2012-12-04 NOTE — Progress Notes (Signed)
Subjective:    Patient ID: Janece Canterbury, female    DOB: [birth year], 27 y.o.   MRN: [id number] This chart was scribed for Ellamae Sia, MD by Clydene Laming, ED Scribe. This patient was seen in room 02 and the patient's care was started at 7:04 PM. Otalgia  Associated symptoms include coughing and a sore throat.   HPI Comments: Abbi Mancini is a 27 y.o. female who presents to the [facility] complaining of uti smptoms and hent symptoms. Pt states dysuria and frequency onset yesterday. Her last uti was in May. She has been contracting 3 uti's a year for the last few years at least.. She states associated with sex and she is sexually active around 5 x a week. Pt also presents with a cough beginning today with associated sore throat and ear irritation. No fever. One partner/OCPs.   Patient Active Problem List   Diagnosis Date Noted  . Uses oral contraception 08/03/2012  . Insomnia    Past Medical History  Diagnosis Date  . Insomnia   . Kidney stone    Past Surgical History  Procedure Laterality Date  . Tonsillectomy    . Kidney stones     Allergies  Allergen Reactions  . Gardasil [Quadrivalent Hpv (6,11,16,18) Recomb Vac]     questionable    History   Social History  . Marital Status: Legally Separated    Spouse Name: N/A    Number of Children: N/A  . Years of Education: N/A   Occupational History  . Not on file.   Social History Main Topics  . Smoking status: Never Smoker   . Smokeless tobacco: Never Used  . Alcohol Use: .5 - 1 oz/week    1-2 drink(s) per week     Comment: social  . Drug Use: No  . Sexual Activity: Yes    Partners: Male    Birth Control/ Protection: Pill   Other Topics Concern  . Not on file   Social History Narrative  . No narrative on file      Review of Systems  HENT: Positive for ear pain and sore throat.   Respiratory: Positive for cough. Negative for shortness of breath.   Genitourinary: Positive for  dysuria and frequency.       Objective:   Physical Exam  Nursing note and vitals reviewed. Constitutional: She is oriented to person, place, and time. She appears well-developed and well-nourished. No distress.  Awake, alert, nontoxic appearance  HENT:  Head: Normocephalic and atraumatic.  Right Ear: External ear normal.  Left Ear: External ear normal.  Nose: Nose normal.  Mouth/Throat: Oropharynx is clear and moist. No oropharyngeal exudate.  Eyes: Conjunctivae and EOM are normal. Pupils are equal, round, and reactive to light. Left eye exhibits no discharge.  Neck: Normal range of motion. Neck supple. No thyromegaly present.  Cardiovascular: Normal rate, regular rhythm, normal heart sounds and intact distal pulses.   No murmur heard. Pulmonary/Chest: Effort normal and breath sounds normal. No respiratory distress. She has no wheezes.  Abdominal: Soft. Bowel sounds are normal. She exhibits no mass. There is no tenderness. There is no rebound and no guarding.  Musculoskeletal: Normal range of motion. She exhibits no edema.  Lymphadenopathy:    She has no cervical adenopathy.  Neurological: She is alert and oriented to person, place, and time.  Skin: Skin is warm and dry. No rash noted. She is not diaphoretic.  Psychiatric: She has a normal mood and affect. Her behavior  is normal.   Filed Vitals:   12/04/12 1825  BP: 120/66  Pulse: 69  Temp: 98.9 F (37.2 C)  TempSrc: Oral  Resp: 18  Height: 5' (1.524 m)  Weight: 135 lb 3.2 oz (61.326 kg)  SpO2: 100%    Results for orders placed in visit on 12/04/12  POCT URINALYSIS DIPSTICK      Result Value Range   Color, UA yellow     Clarity, UA clear     Glucose, UA neg     Bilirubin, UA neg     Ketones, UA neg     Spec Grav, UA 1.010     Blood, UA tr-lysed     pH, UA 6.5     Protein, UA neg     Urobilinogen, UA 0.2     Nitrite, UA neg     Leukocytes, UA small (1+)    POCT UA - MICROSCOPIC ONLY      Result Value Range    WBC, Ur, HPF, POC 8-15     RBC, urine, microscopic 0-2     Bacteria, U Microscopic 1+     Mucus, UA neg     Epithelial cells, urine per micros 0-1     Crystals, Ur, HPF, POC neg     Casts, Ur, LPF, POC neg     Yeast, UA neg          Assessment & Plan:  Burning with urination - Plan: POCT urinalysis dipstick, POCT UA - Microscopic Only, Urine culture  Frequency of urination - Plan: POCT urinalysis dipstick, POCT UA - Microscopic Only  Recurrent UTIs--IC too freq to use postcoital antibiotics  Will try to void 100% of time postcoital, incr fluids in general, and use SXT-DS 2 tabs at oset of next 2-3 utis   I have completed the patient encounter in its entirety as documented by the scribe, with editing by me where necessary. Josealfredo Adkins P. Merla Riches, M.D.

## 2012-12-06 ENCOUNTER — Telehealth: Payer: Self-pay

## 2012-12-06 MED ORDER — PHENAZOPYRIDINE HCL 200 MG PO TABS: 200.0000 mg | ORAL_TABLET | Freq: Three times a day (TID) | ORAL | Status: DC | PRN

## 2012-12-06 NOTE — Telephone Encounter (Signed)
Pyridium sent.

## 2012-12-06 NOTE — Telephone Encounter (Signed)
PT STATES SHE WAS TO HAVE THE PYRIDIUM CALLED IN FOR HER AND IT WASN'T. PLEASE CALL [phone]    [facility] [address]

## 2012-12-07 ENCOUNTER — Encounter: Payer: Self-pay | Admitting: Internal Medicine

## 2012-12-07 LAB — URINE CULTURE: Colony Count: >=100000

## 2013-05-02 ENCOUNTER — Encounter: Payer: Self-pay | Admitting: Internal Medicine

## 2013-05-02 ENCOUNTER — Ambulatory Visit (INDEPENDENT_AMBULATORY_CARE_PROVIDER_SITE_OTHER): Payer: BC Managed Care – PPO | Admitting: Internal Medicine

## 2013-05-02 VITALS — BP 120/75 | HR 77 | Temp 98.2°F | Resp 16 | Ht 60.0 in | Wt 138.6 lb

## 2013-05-02 DIAGNOSIS — R5383 Other fatigue: Secondary | ICD-10-CM

## 2013-05-02 DIAGNOSIS — R635 Abnormal weight gain: Secondary | ICD-10-CM

## 2013-05-02 DIAGNOSIS — R300 Dysuria: Secondary | ICD-10-CM

## 2013-05-02 DIAGNOSIS — R5381 Other malaise: Secondary | ICD-10-CM

## 2013-05-02 DIAGNOSIS — N390 Urinary tract infection, site not specified: Secondary | ICD-10-CM

## 2013-05-02 LAB — COMPLETE METABOLIC PANEL WITH GFR
ALT: 22 U/L (ref 0–35)
AST: 18 U/L (ref 0–37)
Albumin: 4 g/dL (ref 3.5–5.2)
Alkaline Phosphatase: 71 U/L (ref 39–117)
BUN: 14 mg/dL (ref 6–23)
CHLORIDE: 101 meq/L (ref 96–112)
CO2: 26 meq/L (ref 19–32)
Calcium: 9.7 mg/dL (ref 8.4–10.5)
Creat: 0.63 mg/dL (ref 0.50–1.10)
GFR, Est Non African American: >89 mL/min
Glucose, Bld: 82 mg/dL (ref 70–99)
Potassium: 4.2 mEq/L (ref 3.5–5.3)
Sodium: 137 mEq/L (ref 135–145)
Total Bilirubin: 0.3 mg/dL (ref 0.2–1.2)
Total Protein: 6.8 g/dL (ref 6.0–8.3)

## 2013-05-02 LAB — CBC WITH DIFFERENTIAL/PLATELET
Basophils Absolute: 0 10*3/uL (ref 0.0–0.1)
Basophils Relative: 0 % (ref 0–1)
Eosinophils Absolute: 0.1 10*3/uL (ref 0.0–0.7)
Eosinophils Relative: 1 % (ref 0–5)
HCT: 37.8 % (ref 36.0–46.0)
HEMOGLOBIN: 12.9 g/dL (ref 12.0–15.0)
LYMPHS ABS: 2.7 10*3/uL (ref 0.7–4.0)
LYMPHS PCT: 31 % (ref 12–46)
MCH: 28.4 pg (ref 26.0–34.0)
MCHC: 34.1 g/dL (ref 30.0–36.0)
MCV: 83.3 fL (ref 78.0–100.0)
MONOS PCT: 9 % (ref 3–12)
Monocytes Absolute: 0.8 10*3/uL (ref 0.1–1.0)
NEUTROS PCT: 59 % (ref 43–77)
Neutro Abs: 5.2 10*3/uL (ref 1.7–7.7)
Platelets: 300 10*3/uL (ref 150–400)
RBC: 4.54 MIL/uL (ref 3.87–5.11)
RDW: 13.1 % (ref 11.5–15.5)
WBC: 8.8 10*3/uL (ref 4.0–10.5)

## 2013-05-02 LAB — LIPID PANEL
Cholesterol: 212 mg/dL — ABNORMAL HIGH (ref 0–200)
HDL: 92 mg/dL (ref >39)
LDL CALC: 101 mg/dL — AB (ref 0–99)
Total CHOL/HDL Ratio: 2.3 Ratio
Triglycerides: 96 mg/dL (ref <150)
VLDL: 19 mg/dL (ref 0–40)

## 2013-05-02 LAB — TSH: TSH: 1.935 u[IU]/mL (ref 0.350–4.500)

## 2013-05-02 MED ORDER — DESONIDE 0.05 % EX CREA: TOPICAL_CREAM | Freq: Two times a day (BID) | CUTANEOUS | Status: DC

## 2013-05-02 NOTE — Progress Notes (Signed)
Subjective:    Patient ID: Taylor Carey, female    DOB: May 01, 1985, 28 y.o.   MRN: [id number] This chart was scribed for Tonye Pearsonobert P Prentiss Polio, MD by Charline BillsEssence Howell, ED Scribe. The patient was seen in room 24. Patient's care was started at 2:50 PM.  HPI HPI Comments: Taylor Carey is a 28 y.o. female who presents to the [facility] for a thyroid check. Pt states that her thyroids were slightly underactive when it was checked approximately 10 years ago. She also reports a low basil metabolic rate, which is what prompted her to come in today. She denies a family history of thyroid issues. She works out 3-4 mornings and 2-3 nights a week for 30 minutes each session. She also uses [employer] application to track her caloric intake. She has gained weight rather than losing it, and she does not believe it is due to muscle gain. She eats dinner anywhere between 6-7:30 PM and goes to bed around 11 PM.  Pt feels restless in the morning despite 7-8 hours of sleep per night. She also reports feeling like she might fall asleep at work, especially around 2 PM. She further states that she would fall asleep if she were to come home from work and attempt to watch a movie. She denies snoring. She has not had reports of excessive movements while sleeping.   She reports dry skin and hair loss, but nothing abnormal. She also reports hot flashes but denies any known menstrual issues since she is taking BC. She also denies allergies, nasal congestion, high bp, and leg swelling.   Pt has only had 1 UTI since she was last seen here. She used previously prescribed medication for treatment. The UTI resolved and she no longer reports any urinary symptoms. She does not which to obtain a UA at this moment.   She needs Desonide refill.   Review of Systems  Constitutional: Negative for fever, activity change and appetite change.  HENT: Negative for dental problem.   Eyes: Negative for visual  disturbance.  Respiratory: Negative for shortness of breath.   Cardiovascular: Negative for chest pain, palpitations and leg swelling.  Gastrointestinal: Negative for abdominal pain, diarrhea and constipation.  Neurological: Negative for weakness and headaches.  Psychiatric/Behavioral: Negative for behavioral problems and sleep disturbance.       Objective:   Physical Exam  Nursing note and vitals reviewed. Constitutional: She is oriented to person, place, and time. She appears well-developed and well-nourished. No distress.  HENT:  Head: Normocephalic and atraumatic.  Eyes: EOM are normal. Pupils are equal, round, and reactive to light.  Neck: Normal range of motion. Neck supple. No thyromegaly present.  Cardiovascular: Normal rate, regular rhythm and normal heart sounds.   No murmur heard. Pulmonary/Chest: Effort normal. No respiratory distress.  Musculoskeletal: Normal range of motion.  Lymphadenopathy:    She has no cervical adenopathy.  Neurological: She is alert and oriented to person, place, and time.  Skin: Skin is warm and dry.  Psychiatric: She has a normal mood and affect. Her behavior is normal. Judgment and thought content normal.       Assessment & Plan:  I have completed the patient encounter in its entirety as documented by the scribe, with editing by me where necessary. Duncan Alejandro P. Merla Richesoolittle, M.D.    Other malaise and fatigue - Plan: CBC with Differential, COMPLETE METABOLIC PANEL WITH GFR, Lipid panel  Weight gain - Plan: TSH  Recurrent UTI w/Dysuria ---stable w/ only  1 episode since last ov   Notify labs Extensive disc re weight/diet etc

## 2013-05-05 ENCOUNTER — Telehealth: Payer: Self-pay

## 2013-05-05 DIAGNOSIS — K219 Gastro-esophageal reflux disease without esophagitis: Secondary | ICD-10-CM | POA: Insufficient documentation

## 2013-05-05 NOTE — Telephone Encounter (Signed)
PATIENT IS REQUESTING TO ADD A LAB FOR THYROID PANEL    [phone]

## 2013-05-21 ENCOUNTER — Ambulatory Visit (INDEPENDENT_AMBULATORY_CARE_PROVIDER_SITE_OTHER): Payer: BC Managed Care – PPO | Admitting: Family Medicine

## 2013-05-21 ENCOUNTER — Ambulatory Visit: Payer: BC Managed Care – PPO

## 2013-05-21 VITALS — BP 110/74 | HR 76 | Temp 98.0°F | Resp 16 | Ht 59.5 in | Wt 136.2 lb

## 2013-05-21 DIAGNOSIS — IMO0002 Reserved for concepts with insufficient information to code with codable children: Secondary | ICD-10-CM

## 2013-05-21 DIAGNOSIS — M79609 Pain in unspecified limb: Secondary | ICD-10-CM

## 2013-05-21 DIAGNOSIS — S6390XA Sprain of unspecified part of unspecified wrist and hand, initial encounter: Secondary | ICD-10-CM

## 2013-05-21 DIAGNOSIS — M79642 Pain in left hand: Secondary | ICD-10-CM
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
## 2013-05-21 NOTE — Patient Instructions (Signed)
Ibuprofen 600-800 mg three times daily with food Wrap wrist and rest for 1 week or until pain has completely resolved for 48-72 hours Recheck in 2 weeks if symptoms fail to improve, sooner if worse

## 2013-05-23 NOTE — Progress Notes (Signed)
Reviewed documentation and xray and agree w/ assessment and plan. Jaymarion Trombly, MD MPH   

## 2013-05-29 ENCOUNTER — Ambulatory Visit (INDEPENDENT_AMBULATORY_CARE_PROVIDER_SITE_OTHER): Payer: BC Managed Care – PPO | Admitting: Certified Nurse Midwife

## 2013-05-29 ENCOUNTER — Encounter: Payer: Self-pay | Admitting: Certified Nurse Midwife

## 2013-05-29 VITALS — BP 100/66 | HR 72 | Resp 16 | Ht 59.5 in | Wt 134.0 lb

## 2013-05-29 DIAGNOSIS — R87619 Unspecified abnormal cytological findings in specimens from cervix uteri: Secondary | ICD-10-CM

## 2013-05-29 DIAGNOSIS — Z124 Encounter for screening for malignant neoplasm of cervix: Secondary | ICD-10-CM

## 2013-05-29 DIAGNOSIS — Z01419 Encounter for gynecological examination (general) (routine) without abnormal findings: Secondary | ICD-10-CM

## 2013-05-29 HISTORY — DX: Unspecified abnormal cytological findings in specimens from cervix uteri: R87.619

## 2013-05-29 NOTE — Patient Instructions (Signed)
General topics  Next pap or exam is  due in 1 year Take a Women's multivitamin Take 1200 mg. of calcium daily - prefer dietary If any concerns in interim to call back  Breast Self-Awareness Practicing breast self-awareness may pick up problems early, prevent significant medical complications, and possibly save your life. By practicing breast self-awareness, you can become familiar with how your breasts look and feel and if your breasts are changing. This allows you to notice changes early. It can also offer you some reassurance that your breast health is good. One way to learn what is normal for your breasts and whether your breasts are changing is to do a breast self-exam. If you find a lump or something that was not present in the past, it is best to contact your caregiver right away. Other findings that should be evaluated by your caregiver include nipple discharge, especially if it is bloody; skin changes or reddening; areas where the skin seems to be pulled in (retracted); or new lumps and bumps. Breast pain is seldom associated with cancer (malignancy), but should also be evaluated by a caregiver. BREAST SELF-EXAM The best time to examine your breasts is 5 7 days after your menstrual period is over.  ExitCare Patient Information 2013 [employer].   Exercise to Stay Healthy Exercise helps you become and stay healthy. EXERCISE IDEAS AND TIPS Choose exercises that:  You enjoy.  Fit into your day. You do not need to exercise really hard to be healthy. You can do exercises at a slow or medium level and stay healthy. You can:  Stretch before and after working out.  Try yoga, Pilates, or tai chi.  Lift weights.  Walk fast, swim, jog, run, climb stairs, bicycle, dance, or rollerskate.  Take aerobic classes. Exercises that burn about 150 calories:  Running 1  miles in 15 minutes.  Playing volleyball for 45 to 60 minutes.  Washing and waxing a car for 45 to 60  minutes.  Playing touch football for 45 minutes.  Walking 1  miles in 35 minutes.  Pushing a stroller 1  miles in 30 minutes.  Playing basketball for 30 minutes.  Raking leaves for 30 minutes.  Bicycling 5 miles in 30 minutes.  Walking 2 miles in 30 minutes.  Dancing for 30 minutes.  Shoveling snow for 15 minutes.  Swimming laps for 20 minutes.  Walking up stairs for 15 minutes.  Bicycling 4 miles in 15 minutes.  Gardening for 30 to 45 minutes.  Jumping rope for 15 minutes.  Washing windows or floors for 45 to 60 minutes. Document Released: 02/06/2010 Document Revised: 03/29/2011 Document Reviewed: 02/06/2010 ExitCare Patient Information 2013 [employer].   Other topics ( that may be useful information):    Sexually Transmitted Disease Sexually transmitted disease (STD) refers to any infection that is passed from person to person during sexual activity. This may happen by way of saliva, semen, blood, vaginal mucus, or urine. Common STDs include:  Gonorrhea.  Chlamydia.  Syphilis.  HIV/AIDS.  Genital herpes.  Hepatitis B and C.  Trichomonas.  Human papillomavirus (HPV).  Pubic lice. CAUSES  An STD may be spread by bacteria, virus, or parasite. A person can get an STD by:  Sexual intercourse with an infected person.  Sharing sex toys with an infected person.  Sharing needles with an infected person.  Having intimate contact with the genitals, mouth, or rectal areas of an infected person. SYMPTOMS  Some people may not have any symptoms, but   they can still pass the infection to others. Different STDs have different symptoms. Symptoms include:  Painful or bloody urination.  Pain in the pelvis, abdomen, vagina, anus, throat, or eyes.  Skin rash, itching, irritation, growths, or sores (lesions). These usually occur in the genital or anal area.  Abnormal vaginal discharge.  Penile discharge in men.  Soft, flesh-colored skin growths in the  genital or anal area.  Fever.  Pain or bleeding during sexual intercourse.  Swollen glands in the groin area.  Yellow skin and eyes (jaundice). This is seen with hepatitis. DIAGNOSIS  To make a diagnosis, your caregiver may:  Take a medical history.  Perform a physical exam.  Take a specimen (culture) to be examined.  Examine a sample of discharge under a microscope.  Perform blood test TREATMENT   Chlamydia, gonorrhea, trichomonas, and syphilis can be cured with antibiotic medicine.  Genital herpes, hepatitis, and HIV can be treated, but not cured, with prescribed medicines. The medicines will lessen the symptoms.  Genital warts from HPV can be treated with medicine or by freezing, burning (electrocautery), or surgery. Warts may come back.  HPV is a virus and cannot be cured with medicine or surgery.However, abnormal areas may be followed very closely by your caregiver and may be removed from the cervix, vagina, or vulva through office procedures or surgery. If your diagnosis is confirmed, your recent sexual partners need treatment. This is true even if they are symptom-free or have a negative culture or evaluation. They should not have sex until their caregiver says it is okay. HOME CARE INSTRUCTIONS  All sexual partners should be informed, tested, and treated for all STDs.  Take your antibiotics as directed. Finish them even if you start to feel better.  Only take over-the-counter or prescription medicines for pain, discomfort, or fever as directed by your caregiver.  Rest.  Eat a balanced diet and drink enough fluids to keep your urine clear or pale yellow.  Do not have sex until treatment is completed and you have followed up with your caregiver. STDs should be checked after treatment.  Keep all follow-up appointments, Pap tests, and blood tests as directed by your caregiver.  Only use latex condoms and water-soluble lubricants during sexual activity. Do not use  petroleum jelly or oils.  Avoid alcohol and illegal drugs.  Get vaccinated for HPV and hepatitis. If you have not received these vaccines in the past, talk to your caregiver about whether one or both might be right for you.  Avoid risky sex practices that can break the skin. The only way to avoid getting an STD is to avoid all sexual activity.Latex condoms and dental dams (for oral sex) will help lessen the risk of getting an STD, but will not completely eliminate the risk. SEEK MEDICAL CARE IF:   You have a fever.  You have any new or worsening symptoms. Document Released: 03/27/2002 Document Revised: 03/29/2011 Document Reviewed: 04/03/2010 [facility] Patient Information 2013 [employer].    Domestic Abuse You are being battered or abused if someone close to you hits, pushes, or physically hurts you in any way. You also are being abused if you are forced into activities. You are being sexually abused if you are forced to have sexual contact of any kind. You are being emotionally abused if you are made to feel worthless or if you are constantly threatened. It is important to remember that help is available. No one has the right to abuse you. PREVENTION OF FURTHER  ABUSE  Learn the warning signs of danger. This varies with situations but may include: the use of alcohol, threats, isolation from friends and family, or forced sexual contact. Leave if you feel that violence is going to occur.  If you are attacked or beaten, report it to the police so the abuse is documented. You do not have to press charges. The police can protect you while you or the attackers are leaving. Get the officer's name and badge number and a copy of the report.  Find someone you can trust and tell them what is happening to you: your caregiver, a nurse, clergy member, close friend or family member. Feeling ashamed is natural, but remember that you have done nothing wrong. No one deserves abuse. Document Released:  01/02/2000 Document Revised: 03/29/2011 Document Reviewed: 03/12/2010 ExitCare Patient Information 2013 [employer].    How Much is Too Much Alcohol? Drinking too much alcohol can cause injury, accidents, and health problems. These types of problems can include:   Car crashes.  Falls.  Family fighting (domestic violence).  Drowning.  Fights.  Injuries.  Burns.  Damage to certain organs.  Having a baby with birth defects. ONE DRINK CAN BE TOO MUCH WHEN YOU ARE:  Working.  Pregnant or breastfeeding.  Taking medicines. Ask your doctor.  Driving or planning to drive. If you or someone you know has a drinking problem, get help from a doctor.  Document Released: 10/31/2008 Document Revised: 03/29/2011 Document Reviewed: 10/31/2008 ExitCare Patient Information 2013 [employer].   Smoking Hazards Smoking cigarettes is extremely bad for your health. Tobacco smoke has over 200 known poisons in it. There are over 60 chemicals in tobacco smoke that cause cancer. Some of the chemicals found in cigarette smoke include:   Cyanide.  Benzene.  Formaldehyde.  Methanol (wood alcohol).  Acetylene (fuel used in welding torches).  Ammonia. Cigarette smoke also contains the poisonous gases nitrogen oxide and carbon monoxide.  Cigarette smokers have an increased risk of many serious medical problems and Smoking causes approximately:  90% of all lung cancer deaths in men.  80% of all lung cancer deaths in women.  90% of deaths from chronic obstructive lung disease. Compared with nonsmokers, smoking increases the risk of:  Coronary heart disease by 2 to 4 times.  Stroke by 2 to 4 times.  Men developing lung cancer by 23 times.  Women developing lung cancer by 13 times.  Dying from chronic obstructive lung diseases by 12 times.  . Smoking is the most preventable cause of death and disease in our society.  WHY IS SMOKING ADDICTIVE?  Nicotine is the chemical  agent in tobacco that is capable of causing addiction or dependence.  When you smoke and inhale, nicotine is absorbed rapidly into the bloodstream through your lungs. Nicotine absorbed through the lungs is capable of creating a powerful addiction. Both inhaled and non-inhaled nicotine may be addictive.  Addiction studies of cigarettes and spit tobacco show that addiction to nicotine occurs mainly during the teen years, when young people begin using tobacco products. WHAT ARE THE BENEFITS OF QUITTING?  There are many health benefits to quitting smoking.   Likelihood of developing cancer and heart disease decreases. Health improvements are seen almost immediately.  Blood pressure, pulse rate, and breathing patterns start returning to normal soon after quitting. QUITTING SMOKING   American Lung Association - 1-800-LUNGUSA  American Cancer Society - 1-800-ACS-2345 Document Released: 02/12/2004 Document Revised: 03/29/2011 Document Reviewed: 10/16/2008 ExitCare Patient Information 2013 ExitCare,   LLC.   Stress Management Stress is a state of physical or mental tension that often results from changes in your life or normal routine. Some common causes of stress are:  Death of a loved one.  Injuries or severe illnesses.  Getting fired or changing jobs.  Moving into a new home. Other causes may be:  Sexual problems.  Business or financial losses.  Taking on a large debt.  Regular conflict with someone at home or at work.  Constant tiredness from lack of sleep. It is not just bad things that are stressful. It may be stressful to:  Win the lottery.  Get married.  Buy a new car. The amount of stress that can be easily tolerated varies from person to person. Changes generally cause stress, regardless of the types of change. Too much stress can affect your health. It may lead to physical or emotional problems. Too little stress (boredom) may also become stressful. SUGGESTIONS TO  REDUCE STRESS:  Talk things over with your family and friends. It often is helpful to share your concerns and worries. If you feel your problem is serious, you may want to get help from a professional counselor.  Consider your problems one at a time instead of lumping them all together. Trying to take care of everything at once may seem impossible. List all the things you need to do and then start with the most important one. Set a goal to accomplish 2 or 3 things each day. If you expect to do too many in a single day you will naturally fail, causing you to feel even more stressed.  Do not use alcohol or drugs to relieve stress. Although you may feel better for a short time, they do not remove the problems that caused the stress. They can also be habit forming.  Exercise regularly - at least 3 times per week. Physical exercise can help to relieve that "uptight" feeling and will relax you.  The shortest distance between despair and hope is often a good night's sleep.  Go to bed and get up on time allowing yourself time for appointments without being rushed.  Take a short "time-out" period from any stressful situation that occurs during the day. Close your eyes and take some deep breaths. Starting with the muscles in your face, tense them, hold it for a few seconds, then relax. Repeat this with the muscles in your neck, shoulders, hand, stomach, back and legs.  Take good care of yourself. Eat a balanced diet and get plenty of rest.  Schedule time for having fun. Take a break from your daily routine to relax. HOME CARE INSTRUCTIONS   Call if you feel overwhelmed by your problems and feel you can no longer manage them on your own.  Return immediately if you feel like hurting yourself or someone else. Document Released: 06/30/2000 Document Revised: 03/29/2011 Document Reviewed: 02/20/2007 ExitCare Patient Information 2013 ExitCare[employer].   

## 2013-05-29 NOTE — Progress Notes (Signed)
28 y.o. G0P0000 Divorced Caucasian Fe here for annual exam.  Periods normal, no issues. No STD concerns or testing desired. "Life much better after divorce". Seeing Endocrine for evaluation for concerns with thyroid. Contraception working well , except for slight fluid retention, but has increased water intake. No other health issues today.  Patient's last menstrual period was 05/13/2013.          Sexually active: yes  The current method of family planning is OCP (estrogen/progesterone).    Exercising: yes  cardio & strength training Smoker:  no  Health Maintenance: Pap: 04-09-10 neg MMG:  none Colonoscopy:  none BMD:   none TDaP:  2014 Labs: none Self breast exam:not done   reports that she has never smoked. She has never used smokeless tobacco. She reports that she drinks about .5 - 1 ounces of alcohol per week. She reports that she does not use illicit drugs.  Past Medical History  Diagnosis Date  . Insomnia   . Kidney stone     Past Surgical History  Procedure Laterality Date  . Tonsillectomy    . Kidney stones      Current Outpatient Prescriptions  Medication Sig Dispense Refill  . desonide (DESOWEN) 0.05 % cream Apply topically 2 (two) times daily.  30 g  0  . drospirenone-ethinyl estradiol (YAZ,GIANVI,LORYNA) 3-0.02 MG tablet Take 1 tablet by mouth daily.  1 Package  12  . omeprazole (PRILOSEC) 20 MG capsule Take 20 mg by mouth daily.       No current facility-administered medications for this visit.    Family History  Problem Relation Age of Onset  . Crohn's disease Mother   . Hypertension Mother   . Kidney disease Mother   . Cancer Mother     female cancer  . Cancer Father     kidney  . Alzheimer's disease Maternal Grandfather   . Parkinson's disease Paternal Grandfather     ROS:  Pertinent items are noted in HPI.  Otherwise, a comprehensive ROS was negative.  Exam:   BP 100/66  Pulse 72  Resp 16  Ht 4' 11.5" (1.511 m)  Wt 134 lb (60.782 kg)  BMI  26.62 kg/m2  LMP 05/13/2013 Height: 4' 11.5" (151.1 cm)  Ht Readings from Last 3 Encounters:  05/29/13 4' 11.5" (1.511 m)  05/21/13 4' 11.5" (1.511 m)  05/02/13 5' (1.524 m)    General appearance: alert, cooperative and appears stated age Head: Normocephalic, without obvious abnormality, atraumatic Neck: no adenopathy, supple, symmetrical, trachea midline and thyroid normal to inspection and palpation and non-palpable Lungs: clear to auscultation bilaterally Breasts: normal appearance, no masses or tenderness, No nipple retraction or dimpling, No nipple discharge or bleeding, No axillary or supraclavicular adenopathy Heart: regular rate and rhythm Abdomen: soft, non-tender; no masses,  no organomegaly Extremities: extremities normal, atraumatic, no cyanosis or edema Skin: Skin color, texture, turgor normal. No rashes or lesions Lymph nodes: Cervical, supraclavicular, and axillary nodes normal. No abnormal inguinal nodes palpated Neurologic: Grossly normal   Pelvic: External genitalia:  no lesions              Urethra:  normal appearing urethra with no masses, tenderness or lesions              Bartholin's and Skene's: normal                 Vagina: normal appearing vagina with normal color and discharge, no lesions  Cervix: normal, non tender              Pap taken: yes Bimanual Exam:  Uterus:  normal size, contour, position, consistency, mobility, non-tender and anteverted              Adnexa: normal adnexa and no mass, fullness, tenderness               Rectovaginal: Confirms               Anus:  deferred  A:  Well Woman with normal exam  Contraception OCP   P:   Reviewed health and wellness pertinent to exam  Rx Giavani see order  Endocrine evaluation for thyroid suggested  Pap smear taken today with HPV reflex Reviewed STD prevention, diet and exercise and importance of breast exam.   return annually or prn  An After Visit Summary was printed and given to  the patient.

## 2013-05-31 NOTE — Progress Notes (Signed)
Reviewed personally.  M. Suzanne Adrik Khim, MD.  

## 2013-06-05 ENCOUNTER — Other Ambulatory Visit: Payer: Self-pay | Admitting: Certified Nurse Midwife

## 2013-06-05 DIAGNOSIS — R87810 Cervical high risk human papillomavirus (HPV) DNA test positive: Principal | ICD-10-CM

## 2013-06-05 DIAGNOSIS — R87610 Atypical squamous cells of undetermined significance on cytologic smear of cervix (ASC-US): Secondary | ICD-10-CM

## 2013-06-05 LAB — IPS PAP TEST WITH REFLEX TO HPV

## 2013-06-10 ENCOUNTER — Other Ambulatory Visit: Payer: Self-pay | Admitting: Certified Nurse Midwife

## 2013-06-12 ENCOUNTER — Telehealth: Payer: Self-pay | Admitting: Certified Nurse Midwife

## 2013-06-12 ENCOUNTER — Telehealth: Payer: Self-pay | Admitting: Emergency Medicine

## 2013-06-12 NOTE — Telephone Encounter (Signed)
Spoke with patient. Advised that per benefit quote received, she will be responsible for $340.64 when she comes in for colpo. Patient agreeable.

## 2013-06-12 NOTE — Telephone Encounter (Signed)
Last AEX 05/29/13  - Rx sent for 1 year. Per Ms Debbie's AEX note

## 2013-06-12 NOTE — Telephone Encounter (Signed)
  Patient notified of message from Verner Chol CNM.  She is agreeable to scheduling colposcopy. Brief description of procedure given to patient.  Colposcopy pre-procedure instructions given. Advised 800 mg of Motrin with food one hour prior to appointment. Motrin=Advil=Ibuprofen Can take 800 mg (Can purchase over the counter, you will need four 200 mg pills). Make sure to eat a meal before appointment and drink plenty of fluids. Patient verbalized understanding and will call to reschedule if will be on menses or has any concerns regarding pregnancy. Advised will need to cancel within 24 hours or will have $100.00 no show fee placed to account.  Type of birth control OC Copayment: $PR $340.64--Sabrina aware to call and leave detailed message.

## 2013-06-12 NOTE — Telephone Encounter (Signed)
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 Patient needs colposcopy for evaluation      Order in ------

## 2013-06-26 ENCOUNTER — Encounter: Payer: Self-pay | Admitting: Certified Nurse Midwife

## 2013-06-26 ENCOUNTER — Ambulatory Visit (INDEPENDENT_AMBULATORY_CARE_PROVIDER_SITE_OTHER): Payer: BC Managed Care – PPO | Admitting: Certified Nurse Midwife

## 2013-06-26 VITALS — BP 104/68 | HR 64 | Resp 16 | Ht 59.5 in | Wt 133.0 lb

## 2013-06-26 DIAGNOSIS — R87610 Atypical squamous cells of undetermined significance on cytologic smear of cervix (ASC-US): Secondary | ICD-10-CM

## 2013-06-26 DIAGNOSIS — R87810 Cervical high risk human papillomavirus (HPV) DNA test positive: Secondary | ICD-10-CM

## 2013-06-26 NOTE — Progress Notes (Signed)
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

## 2013-06-26 NOTE — Patient Instructions (Signed)

## 2013-06-26 NOTE — Progress Notes (Signed)
Patient ID: Taylor Carey, female   DOB: 02-Sep-1985, 28 y.o.   MRN: [id number]  Chief Complaint  Patient presents with  . Colposcopy    HPI Taylor Carey is a 28 y.o. white single female.  [id number] here for colposcopy exam. Denies vaginal bleeding or pelvic pain.  HPI  Indications: Pap smear on 5/13 2015 showed: ASCUS with POSITIVE high risk HPV. Previous colposcopy: none. Prior cervical treatment: none.  Past Medical History  Diagnosis Date  . Insomnia   . Kidney stone   . Abnormal Pap smear of cervix 05/29/13    ASCUS +HPV HR    Past Surgical History  Procedure Laterality Date  . Tonsillectomy    . Kidney stones      Family History  Problem Relation Age of Onset  . Crohn's disease Mother   . Hypertension Mother   . Kidney disease Mother   . Cancer Mother     female cancer  . Cancer Father     kidney  . Alzheimer's disease Maternal Grandfather   . Parkinson's disease Paternal Grandfather     Social History History  Substance Use Topics  . Smoking status: Never Smoker   . Smokeless tobacco: Never Used  . Alcohol Use: 0.5 - 1.0 oz/week    1-2 drink(s) per week     Comment: social    Allergies  Allergen Reactions  . Gardasil [Hpv Vaccine Recombinant (Yeast Derived)]     questionable    Current Outpatient Prescriptions  Medication Sig Dispense Refill  . desonide (DESOWEN) 0.05 % cream Apply topically 2 (two) times daily.  30 g  0  . GIANVI 3-0.02 MG tablet TAKE ONE TABLET BY MOUTH ONE TIME DAILY   28 tablet  11  . omeprazole (PRILOSEC) 20 MG capsule Take 20 mg by mouth daily.       No current facility-administered medications for this visit.    Review of Systems Review of Systems  Constitutional: Negative.   Genitourinary: Negative for vaginal bleeding, vaginal discharge and vaginal pain.    Blood pressure 104/68, pulse 64, resp. rate 16, height 4' 11.5" (1.511 m), weight 133 lb (60.328 kg), last menstrual period 06/10/2013.  Physical Exam Physical  Exam  Constitutional: She is oriented to person, place, and time. She appears well-developed and well-nourished.  Genitourinary: Vagina normal. No tenderness around the vagina. No vaginal discharge found.    Neurological: She is alert and oriented to person, place, and time.  Skin: Skin is warm and dry.  Psychiatric: She has a normal mood and affect. Her behavior is normal. Judgment normal.    Data Reviewed Reviewed pap smear results with patient and addressed questions regarding HPVHR, and procedure.  Assessment   Colposcopy exam for ASCUS + HPVHR Procedure Details  The risks and benefits of the procedure and Written informed consent obtained.  Speculum placed in vagina and excellent visualization of cervix achieved, cervix swabbed x 3 with saline and  with acetic acid solution. Cervix viewed with 3.75,7.5,15 # and green filter. Acetowhite effect noted at 12 o'clock and also at 7 o'clock with mosaic pattern. Lugols applied and non staining noted in same area. Biopsy taken  At 7 o'clock and at 12 o'clock. ECC obtained. Monsel's applied. No bleeding noted on removal of speculum. Patient tolerated procedure well. Instructions given.  Specimens: 3  Complications: none.     Plan    Specimens labelled and sent to Pathology. Pathology will be reviewed and patient will contacted with results.  Verner Chol 06/26/2013, 2:12 PM

## 2013-06-28 LAB — IPS OTHER TISSUE BIOPSY

## 2013-06-30 NOTE — Progress Notes (Signed)
Reviewed personally.  M. Suzanne Kenslei Hearty, MD.  

## 2013-07-05 ENCOUNTER — Telehealth: Payer: Self-pay

## 2013-07-05 NOTE — Telephone Encounter (Signed)
Mailbox full. Try again. 

## 2013-07-05 NOTE — Telephone Encounter (Signed)
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 The results correlate well with the pap smear findings. Patient needs follow up pap smear in one year. Very important to keep this appointment.      Pap recall 08 ------

## 2013-07-09 NOTE — Telephone Encounter (Signed)
Left message for call back.

## 2013-07-11 NOTE — Telephone Encounter (Signed)
Left message for call back.

## 2013-07-11 NOTE — Telephone Encounter (Signed)
Left message to call back  

## 2013-07-11 NOTE — Telephone Encounter (Signed)
Patient is calling joy back °

## 2013-07-11 NOTE — Telephone Encounter (Signed)
Patient notified of results as written by provider. Pt aware to keep aex appt & its importance.08 recall is in for 1 yr

## 2013-07-16 ENCOUNTER — Ambulatory Visit: Payer: BC Managed Care – PPO | Admitting: Certified Nurse Midwife

## 2013-07-16 ENCOUNTER — Telehealth: Payer: Self-pay | Admitting: *Deleted

## 2013-07-16 NOTE — Telephone Encounter (Signed)
LMTCB

## 2013-09-28 ENCOUNTER — Telehealth: Payer: Self-pay | Admitting: *Deleted

## 2013-09-28 NOTE — Telephone Encounter (Signed)
Opened in error

## 2013-09-28 NOTE — Telephone Encounter (Signed)
See previous encounter. Joy has been unable to reach patient regarding colpo results. Call to patient, notified of colpo results from Debbi, confirms LGSIL and correlates with pap. See result note. Stressed importance of follow up pap and AEX in may next year.Patient  is scheduled for 06-04-14. 08 recall entered.  Routing to provider for final review. Patient agreeable to disposition. Will close encounter

## 2013-11-04 ENCOUNTER — Ambulatory Visit (INDEPENDENT_AMBULATORY_CARE_PROVIDER_SITE_OTHER): Payer: BC Managed Care – PPO | Admitting: Physician Assistant

## 2013-11-04 VITALS — BP 114/64 | HR 83 | Temp 97.9°F | Resp 16 | Ht 61.0 in | Wt 138.0 lb

## 2013-11-04 DIAGNOSIS — N390 Urinary tract infection, site not specified: Secondary | ICD-10-CM

## 2013-11-04 DIAGNOSIS — R300 Dysuria: Secondary | ICD-10-CM

## 2013-11-04 LAB — POCT UA - MICROSCOPIC ONLY
CASTS, UR, LPF, POC: NEGATIVE
CRYSTALS, UR, HPF, POC: NEGATIVE
Mucus, UA: NEGATIVE
RBC, urine, microscopic: NEGATIVE
YEAST UA: NEGATIVE

## 2013-11-04 LAB — POCT URINALYSIS DIPSTICK
BILIRUBIN UA: NEGATIVE
Blood, UA: NEGATIVE
Glucose, UA: NEGATIVE
Ketones, UA: NEGATIVE
Nitrite, UA: NEGATIVE
PH UA: 8.5
PROTEIN UA: NEGATIVE
Spec Grav, UA: 1.02
UROBILINOGEN UA: 0.2

## 2013-11-04 MED ORDER — CIPROFLOXACIN HCL 500 MG PO TABS: 500.0000 mg | ORAL_TABLET | Freq: Two times a day (BID) | ORAL | Status: AC

## 2013-11-04 MED ORDER — PHENAZOPYRIDINE HCL 200 MG PO TABS: 200.0000 mg | ORAL_TABLET | Freq: Three times a day (TID) | ORAL | Status: DC | PRN

## 2013-11-04 NOTE — Progress Notes (Signed)
Subjective:    Patient ID: Taylor Carey, female    DOB: 09-Dec-1985, 28 y.o.   MRN: [id number]  Dysuria     Taylor Carey is a 28 y.o. female presenting for dysuria since this am. Patient has had difficulty with frequent UTI's, reports that she gets 3-5 UTI's/year, resolved with antibx. Today, she reports burning sensation with urination, cloudy and malodorous urine, increased frequency. Admits that unfortunately, she knows very well what a UTI for her feels like and this is very similar to what she has had in the past. Denies hematuria, urgency, suprapubic pain, fevers, n/v, diarrhea, no flank pain, abdominal pain. Patient is in a monogamous relationship and has low suspicion of sexually transmitted infection. Denies vaginal discharge, pain, declined STI testing. Of note, in the past Macrobid (tightness in her chest) and Bactrim (severe nausea) have caused adverse reactions in the past. She has tolerated Cipro well and has achieved resolution of symptoms in the past with this antibiotic. She also has never been evaluated further by a urologist for frequent UTI's and states that she will consider further evaluation.   Prior to Admission medications   Medication Sig Start Date End Date Taking? Authorizing Provider  desonide (DESOWEN) 0.05 % cream Apply topically 2 (two) times daily. 05/02/13  Yes Tonye Pearsonobert P Doolittle, MD  GIANVI 3-0.02 MG tablet TAKE ONE TABLET BY MOUTH ONE TIME DAILY    Yes Verner Choleborah S Leonard, CNM  omeprazole (PRILOSEC) 20 MG capsule Take 20 mg by mouth daily.   Yes Historical Provider, MD    Allergies  Allergen Reactions  . Gardasil [Hpv Vaccine Recombinant (Yeast Derived)]     questionable  . Macrobid [Nitrofurantoin]     Chest tightness    Review of Systems  Genitourinary: Positive for dysuria.   As in subjective.    Objective:   Physical Exam  Vitals reviewed. Constitutional: She is oriented to person, place, and time. She appears well-developed and  well-nourished. No distress.  BP 114/64  Pulse 83  Temp(Src) 97.9 F (36.6 C) (Oral)  Resp 16  Ht 5\' 1"  (1.549 m)  Wt 138 lb (62.596 kg)  BMI 26.09 kg/m2  SpO2 98%  LMP 10/28/2013   Cardiovascular: Normal rate, regular rhythm, normal heart sounds and intact distal pulses.  Exam reveals no gallop and no friction rub.   No murmur heard. Pulmonary/Chest: Effort normal and breath sounds normal. No respiratory distress. She has no wheezes.  Abdominal: Soft. Bowel sounds are normal. She exhibits no distension and no mass. There is no tenderness.  Neurological: She is alert and oriented to person, place, and time.  Skin: Skin is warm. No rash noted. She is not diaphoretic.   Results for orders placed in visit on 11/04/13 (from the past 24 hour(s))  POCT URINALYSIS DIPSTICK     Status: None   Collection Time    11/04/13 10:21 AM      Result Value Ref Range   Color, UA yellow     Clarity, UA slightly cloudy     Glucose, UA neg     Bilirubin, UA neg     Ketones, UA neg     Spec Grav, UA 1.020     Blood, UA neg     pH, UA 8.5     Protein, UA neg     Urobilinogen, UA 0.2     Nitrite, UA neg     Leukocytes, UA Trace    POCT UA - MICROSCOPIC ONLY  Status: None   Collection Time    11/04/13 10:21 AM      Result Value Ref Range   WBC, Ur, HPF, POC 20-30     RBC, urine, microscopic neg     Bacteria, U Microscopic trace     Mucus, UA neg     Epithelial cells, urine per micros 2-6     Crystals, Ur, HPF, POC neg     Casts, Ur, LPF, POC neg     Yeast, UA neg          Assessment & Plan:   1. Urinary tract infection without hematuria, site unspecified 2. Dysuria Uncomplicated recurrent UTI, Rx Cipro and phenazopyridine, urine cultures pending, return to clinic if symptoms worsen, fail to resolve or as needed - POCT urinalysis dipstick - POCT UA - Microscopic Only - Urine culture   Wallis BambergMario Nithya Meriweather, PA-C [facility] [phone] 11/04/2013 10:41 AM

## 2013-11-04 NOTE — Progress Notes (Signed)
I have discussed this patient with Mr. Mani, PA-C and agree.  

## 2013-11-07 LAB — URINE CULTURE

## 2014-01-29 ENCOUNTER — Ambulatory Visit (INDEPENDENT_AMBULATORY_CARE_PROVIDER_SITE_OTHER): Payer: BLUE CROSS/BLUE SHIELD | Admitting: Physician Assistant

## 2014-01-29 VITALS — BP 124/78 | HR 100 | Temp 97.9°F | Resp 16 | Ht 60.0 in | Wt 142.4 lb

## 2014-01-29 DIAGNOSIS — R350 Frequency of micturition: Secondary | ICD-10-CM

## 2014-01-29 DIAGNOSIS — R10819 Abdominal tenderness, unspecified site: Secondary | ICD-10-CM

## 2014-01-29 DIAGNOSIS — R300 Dysuria: Secondary | ICD-10-CM

## 2014-01-29 DIAGNOSIS — N390 Urinary tract infection, site not specified: Secondary | ICD-10-CM

## 2014-01-29 LAB — POCT URINALYSIS DIPSTICK
BILIRUBIN UA: NEGATIVE
Glucose, UA: NEGATIVE
Ketones, UA: NEGATIVE
Nitrite, UA: NEGATIVE
Protein, UA: NEGATIVE
Urobilinogen, UA: 0.2
pH, UA: 6

## 2014-01-29 LAB — POCT UA - MICROSCOPIC ONLY
CASTS, UR, LPF, POC: NEGATIVE
Crystals, Ur, HPF, POC: NEGATIVE
Mucus, UA: NEGATIVE
YEAST UA: NEGATIVE

## 2014-01-29 LAB — POCT CBC
Granulocyte percent: 64 %G (ref 37–80)
HEMATOCRIT: 39.7 % (ref 37.7–47.9)
Hemoglobin: 13.1 g/dL (ref 12.2–16.2)
Lymph, poc: 2.5 (ref 0.6–3.4)
MCH, POC: 28.6 pg (ref 27–31.2)
MCHC: 32.9 g/dL (ref 31.8–35.4)
MCV: 87.1 fL (ref 80–97)
MID (cbc): 0.7 (ref 0–0.9)
MPV: 7.9 fL (ref 0–99.8)
POC Granulocyte: 5.7 (ref 2–6.9)
POC LYMPH %: 28.6 % (ref 10–50)
POC MID %: 7.4 %M (ref 0–12)
Platelet Count, POC: 268 10*3/uL (ref 142–424)
RBC: 4.56 M/uL (ref 4.04–5.48)
RDW, POC: 12.8 %
WBC: 8.9 10*3/uL (ref 4.6–10.2)

## 2014-01-29 MED ORDER — CEFTRIAXONE SODIUM 1 G IJ SOLR
500.0000 mg | Freq: Once | INTRAMUSCULAR | Status: AC
  Administered 2014-01-29: 500 mg via INTRAMUSCULAR

## 2014-01-29 MED ORDER — PHENAZOPYRIDINE HCL 200 MG PO TABS: 200.0000 mg | ORAL_TABLET | Freq: Three times a day (TID) | ORAL | Status: DC | PRN

## 2014-01-29 MED ORDER — CIPROFLOXACIN HCL 500 MG PO TABS: 500.0000 mg | ORAL_TABLET | Freq: Two times a day (BID) | ORAL | Status: DC

## 2014-01-29 NOTE — Progress Notes (Signed)
IDENTIFYING INFORMATION  Taylor CanterburyBrittany Carey / DOB: 30-Apr-1985 / MRN: [id number]  The patient has Insomnia and Recurrent UTI (urinary tract infection) on her problem list.  SUBJECTIVE  CC: Dysuria and Urinary Frequency   HPI: Ms. Taylor Carey is a 29 y.o. y.o. female presenting for dysuria and urinary frequency. She reports this started yesterday mid day.  She has not taken anything for this problem. She denies flank pain.  She reports getting at least 3 UTI per year, and would like to speak to a Urologist regarding this problem.    She reports having a rash on the left    She  has a past medical history of Insomnia; Kidney stone; and Abnormal Pap smear of cervix (05/29/13).    She has a current medication list which includes the following prescription(s): desonide, gianvi, and omeprazole.  Ms. Taylor Carey is allergic to bactrim; gardasil; and macrobid. She  reports that she has never smoked. She has never used smokeless tobacco. She reports that she drinks about 0.5 - 1.0 oz of alcohol per week. She reports that she does not use illicit drugs. She  reports that she currently engages in sexual activity and has had female partners. She reports using the following method of birth control/protection: Pill.  The patient  has past surgical history that includes Tonsillectomy and kidney stones.  Her family history includes Alzheimer's disease in her maternal grandfather; Cancer in her father and mother; Crohn's disease in her mother; Hypertension in her mother; Kidney disease in her mother; Parkinson's disease in her paternal grandfather.  Review of Systems  Constitutional: Negative for fever, chills and diaphoresis.  Respiratory: Negative for cough.   Gastrointestinal: Positive for nausea and abdominal pain. Negative for diarrhea and constipation.  Genitourinary: Positive for dysuria, urgency and frequency. Negative for hematuria and flank pain.  Skin: Positive for rash.  Neurological: Negative for  dizziness and headaches.    OBJECTIVE  Blood pressure 124/78, pulse 100, temperature 97.9 F (36.6 C), temperature source Oral, resp. rate 16, height 5' (1.524 m), weight 142 lb 6.4 oz (64.592 kg), last menstrual period 01/21/2014, SpO2 99 %. The patient's body mass index is 27.81 kg/(m^2).  Physical Exam  Constitutional: She is oriented to person, place, and time. She appears well-developed and well-nourished. No distress.  Cardiovascular: Normal rate and regular rhythm.   Respiratory: Effort normal and breath sounds normal.  GI: Soft. There is tenderness. There is CVA tenderness (mild).  Neurological: She is alert and oriented to person, place, and time.  Skin: Skin is warm and dry. She is not diaphoretic.  Psychiatric: She has a normal mood and affect.    Results for orders placed or performed in visit on 01/29/14 (from the past 24 hour(s))  POCT UA - Microscopic Only     Status: Abnormal   Collection Time: 01/29/14  9:57 AM  Result Value Ref Range   WBC, Ur, HPF, POC 2-6    RBC, urine, microscopic 0-2    Bacteria, U Microscopic trace    Mucus, UA negative    Epithelial cells, urine per micros 1-2    Crystals, Ur, HPF, POC negative    Casts, Ur, LPF, POC negative    Yeast, UA negative   POCT urinalysis dipstick     Status: Abnormal   Collection Time: 01/29/14  9:57 AM  Result Value Ref Range   Color, UA yellow    Clarity, UA clear    Glucose, UA negative    Bilirubin, UA negative  Ketones, UA negative    Spec Grav, UA <=1.005    Blood, UA trace    pH, UA 6.0    Protein, UA negative    Urobilinogen, UA 0.2    Nitrite, UA negative    Leukocytes, UA Trace   POCT CBC     Status: None   Collection Time: 01/29/14 10:09 AM  Result Value Ref Range   WBC 8.9 4.6 - 10.2 K/uL   Lymph, poc 2.5 0.6 - 3.4   POC LYMPH PERCENT 28.6 10 - 50 %L   MID (cbc) 0.7 0 - 0.9   POC MID % 7.4 0 - 12 %M   POC Granulocyte 5.7 2 - 6.9   Granulocyte percent 64.0 37 - 80 %G   RBC 4.56  4.04 - 5.48 M/uL   Hemoglobin 13.1 12.2 - 16.2 g/dL   HCT, POC 16.1 09.6 - 47.9 %   MCV 87.1 80 - 97 fL   MCH, POC 28.6 27 - 31.2 pg   MCHC 32.9 31.8 - 35.4 g/dL   RDW, POC 04.5 %   Platelet Count, POC 268 142 - 424 K/uL   MPV 7.9 0 - 99.8 fL    ASSESSMENT & PLAN  Taylor Carey was seen today for dysuria and urinary frequency.  Diagnoses and associated orders for this visit:  Dysuria - POCT UA - Microscopic Only - POCT urinalysis dipstick - Urine culture  Urinary frequency - POCT UA - Microscopic Only - POCT urinalysis dipstick  UTI (lower urinary tract infection) - ciprofloxacin (CIPRO) 500 MG tablet; Take 1 tablet (500 mg total) by mouth 2 (two) times daily.  Left flank tenderness: CBC reassuring.  - POCT CBC  Recurrent UTI (urinary tract infection) -     AMB Urology     The patient was instructed to to call or comeback to clinic as needed, or should symptoms warrant.  Deliah Boston, MHS, PA-C [facility] 01/29/2014 10:21 AM

## 2014-01-31 LAB — URINE CULTURE: Colony Count: 10000

## 2014-06-04 ENCOUNTER — Ambulatory Visit (INDEPENDENT_AMBULATORY_CARE_PROVIDER_SITE_OTHER): Payer: BLUE CROSS/BLUE SHIELD | Admitting: Certified Nurse Midwife

## 2014-06-04 ENCOUNTER — Encounter: Payer: Self-pay | Admitting: Certified Nurse Midwife

## 2014-06-04 VITALS — BP 110/70 | HR 70 | Resp 20 | Ht 59.75 in | Wt 145.0 lb

## 2014-06-04 DIAGNOSIS — Z01419 Encounter for gynecological examination (general) (routine) without abnormal findings: Secondary | ICD-10-CM

## 2014-06-04 DIAGNOSIS — Z0000 Encounter for general adult medical examination without abnormal findings: Secondary | ICD-10-CM | POA: Diagnosis not present

## 2014-06-04 DIAGNOSIS — Z3041 Encounter for surveillance of contraceptive pills: Secondary | ICD-10-CM | POA: Diagnosis not present

## 2014-06-04 DIAGNOSIS — Z308 Encounter for other contraceptive management: Secondary | ICD-10-CM | POA: Diagnosis not present

## 2014-06-04 DIAGNOSIS — Z124 Encounter for screening for malignant neoplasm of cervix: Secondary | ICD-10-CM | POA: Diagnosis not present

## 2014-06-04 LAB — POCT URINALYSIS DIPSTICK
Bilirubin, UA: NEGATIVE
Blood, UA: NEGATIVE
Glucose, UA: NEGATIVE
Ketones, UA: NEGATIVE
LEUKOCYTES UA: NEGATIVE
NITRITE UA: NEGATIVE
Protein, UA: NEGATIVE
Urobilinogen, UA: NEGATIVE
pH, UA: 5

## 2014-06-04 MED ORDER — ETONOGESTREL-ETHINYL ESTRADIOL 0.12-0.015 MG/24HR VA RING: 1.0000 | VAGINAL_RING | VAGINAL | Status: DC

## 2014-06-04 MED ORDER — DROSPIRENONE-ETHINYL ESTRADIOL 3-0.02 MG PO TABS: 1.0000 | ORAL_TABLET | Freq: Every day | ORAL | Status: DC

## 2014-06-04 NOTE — Patient Instructions (Signed)
General topics  Next pap or exam is  due in 1 year Take a Women's multivitamin Take 1200 mg. of calcium daily - prefer dietary If any concerns in interim to call back  Breast Self-Awareness Practicing breast self-awareness may pick up problems early, prevent significant medical complications, and possibly save your life. By practicing breast self-awareness, you can become familiar with how your breasts look and feel and if your breasts are changing. This allows you to notice changes early. It can also offer you some reassurance that your breast health is good. One way to learn what is normal for your breasts and whether your breasts are changing is to do a breast self-exam. If you find a lump or something that was not present in the past, it is best to contact your caregiver right away. Other findings that should be evaluated by your caregiver include nipple discharge, especially if it is bloody; skin changes or reddening; areas where the skin seems to be pulled in (retracted); or new lumps and bumps. Breast pain is seldom associated with cancer (malignancy), but should also be evaluated by a caregiver. BREAST SELF-EXAM The best time to examine your breasts is 5 7 days after your menstrual period is over.  [employer] Patient Information 2013 [place].   Exercise to Stay Healthy Exercise helps you become and stay healthy. EXERCISE IDEAS AND TIPS Choose exercises that:  You enjoy.  Fit into your day. You do not need to exercise really hard to be healthy. You can do exercises at a slow or medium level and stay healthy. You can:  Stretch before and after working out.  Try yoga, Pilates, or tai chi.  Lift weights.  Walk fast, swim, jog, run, climb stairs, bicycle, dance, or rollerskate.  Take aerobic classes. Exercises that burn about 150 calories:  Running 1  miles in 15 minutes.  Playing volleyball for 45 to 60 minutes.  Washing and waxing a car for 45 to 60  minutes.  Playing touch football for 45 minutes.  Walking 1  miles in 35 minutes.  Pushing a stroller 1  miles in 30 minutes.  Playing basketball for 30 minutes.  Raking leaves for 30 minutes.  Bicycling 5 miles in 30 minutes.  Walking 2 miles in 30 minutes.  Dancing for 30 minutes.  Shoveling snow for 15 minutes.  Swimming laps for 20 minutes.  Walking up stairs for 15 minutes.  Bicycling 4 miles in 15 minutes.  Gardening for 30 to 45 minutes.  Jumping rope for 15 minutes.  Washing windows or floors for 45 to 60 minutes. Document Released: 02/06/2010 Document Revised: 03/29/2011 Document Reviewed: 02/06/2010 [facility] Patient Information 2013 [place].   Other topics ( that may be useful information):    Sexually Transmitted Disease Sexually transmitted disease (STD) refers to any infection that is passed from person to person during sexual activity. This may happen by way of saliva, semen, blood, vaginal mucus, or urine. Common STDs include:  Gonorrhea.  Chlamydia.  Syphilis.  HIV/AIDS.  Genital herpes.  Hepatitis B and C.  Trichomonas.  Human papillomavirus (HPV).  Pubic lice. CAUSES  An STD may be spread by bacteria, virus, or parasite. A person can get an STD by:  Sexual intercourse with an infected person.  Sharing sex toys with an infected person.  Sharing needles with an infected person.  Having intimate contact with the genitals, mouth, or rectal areas of an infected person. SYMPTOMS  Some people may not have any symptoms, but  they can still pass the infection to others. Different STDs have different symptoms. Symptoms include:  Painful or bloody urination.  Pain in the pelvis, abdomen, vagina, anus, throat, or eyes.  Skin rash, itching, irritation, growths, or sores (lesions). These usually occur in the genital or anal area.  Abnormal vaginal discharge.  Penile discharge in men.  Soft, flesh-colored skin growths in the  genital or anal area.  Fever.  Pain or bleeding during sexual intercourse.  Swollen glands in the groin area.  Yellow skin and eyes (jaundice). This is seen with hepatitis. DIAGNOSIS  To make a diagnosis, your caregiver may:  Take a medical history.  Perform a physical exam.  Take a specimen (culture) to be examined.  Examine a sample of discharge under a microscope.  Perform blood test TREATMENT   Chlamydia, gonorrhea, trichomonas, and syphilis can be cured with antibiotic medicine.  Genital herpes, hepatitis, and HIV can be treated, but not cured, with prescribed medicines. The medicines will lessen the symptoms.  Genital warts from HPV can be treated with medicine or by freezing, burning (electrocautery), or surgery. Warts may come back.  HPV is a virus and cannot be cured with medicine or surgery.However, abnormal areas may be followed very closely by your caregiver and may be removed from the cervix, vagina, or vulva through office procedures or surgery. If your diagnosis is confirmed, your recent sexual partners need treatment. This is true even if they are symptom-free or have a negative culture or evaluation. They should not have sex until their caregiver says it is okay. HOME CARE INSTRUCTIONS  All sexual partners should be informed, tested, and treated for all STDs.  Take your antibiotics as directed. Finish them even if you start to feel better.  Only take over-the-counter or prescription medicines for pain, discomfort, or fever as directed by your caregiver.  Rest.  Eat a balanced diet and drink enough fluids to keep your urine clear or pale yellow.  Do not have sex until treatment is completed and you have followed up with your caregiver. STDs should be checked after treatment.  Keep all follow-up appointments, Pap tests, and blood tests as directed by your caregiver.  Only use latex condoms and water-soluble lubricants during sexual activity. Do not use  petroleum jelly or oils.  Avoid alcohol and illegal drugs.  Get vaccinated for HPV and hepatitis. If you have not received these vaccines in the past, talk to your caregiver about whether one or both might be right for you.  Avoid risky sex practices that can break the skin. The only way to avoid getting an STD is to avoid all sexual activity.Latex condoms and dental dams (for oral sex) will help lessen the risk of getting an STD, but will not completely eliminate the risk. SEEK MEDICAL CARE IF:   You have a fever.  You have any new or worsening symptoms. Document Released: 03/27/2002 Document Revised: 03/29/2011 Document Reviewed: 04/03/2010 [facility] Patient Information 2013 [place].    Domestic Abuse You are being battered or abused if someone close to you hits, pushes, or physically hurts you in any way. You also are being abused if you are forced into activities. You are being sexually abused if you are forced to have sexual contact of any kind. You are being emotionally abused if you are made to feel worthless or if you are constantly threatened. It is important to remember that help is available. No one has the right to abuse you. PREVENTION OF FURTHER  ABUSE  Learn the warning signs of danger. This varies with situations but may include: the use of alcohol, threats, isolation from friends and family, or forced sexual contact. Leave if you feel that violence is going to occur.  If you are attacked or beaten, report it to the police so the abuse is documented. You do not have to press charges. The police can protect you while you or the attackers are leaving. Get the officer's name and badge number and a copy of the report.  Find someone you can trust and tell them what is happening to you: your caregiver, a nurse, clergy member, close friend or family member. Feeling ashamed is natural, but remember that you have done nothing wrong. No one deserves abuse. Document Released:  01/02/2000 Document Revised: 03/29/2011 Document Reviewed: 03/12/2010 [facility] Patient Information 2013 [place].    How Much is Too Much Alcohol? Drinking too much alcohol can cause injury, accidents, and health problems. These types of problems can include:   Car crashes.  Falls.  Family fighting (domestic violence).  Drowning.  Fights.  Injuries.  Burns.  Damage to certain organs.  Having a baby with birth defects. ONE DRINK CAN BE TOO MUCH WHEN YOU ARE:  Working.  Pregnant or breastfeeding.  Taking medicines. Ask your doctor.  Driving or planning to drive. If you or someone you know has a drinking problem, get help from a doctor.  Document Released: 10/31/2008 Document Revised: 03/29/2011 Document Reviewed: 10/31/2008 [facility] Patient Information 2013 [place].   Smoking Hazards Smoking cigarettes is extremely bad for your health. Tobacco smoke has over 200 known poisons in it. There are over 60 chemicals in tobacco smoke that cause cancer. Some of the chemicals found in cigarette smoke include:   Cyanide.  Benzene.  Formaldehyde.  Methanol (wood alcohol).  Acetylene (fuel used in welding torches).  Ammonia. Cigarette smoke also contains the poisonous gases nitrogen oxide and carbon monoxide.  Cigarette smokers have an increased risk of many serious medical problems and Smoking causes approximately:  90% of all lung cancer deaths in men.  80% of all lung cancer deaths in women.  90% of deaths from chronic obstructive lung disease. Compared with nonsmokers, smoking increases the risk of:  Coronary heart disease by 2 to 4 times.  Stroke by 2 to 4 times.  Men developing lung cancer by 23 times.  Women developing lung cancer by 13 times.  Dying from chronic obstructive lung diseases by 12 times.  . Smoking is the most preventable cause of death and disease in our society.  WHY IS SMOKING ADDICTIVE?  Nicotine is the chemical  agent in tobacco that is capable of causing addiction or dependence.  When you smoke and inhale, nicotine is absorbed rapidly into the bloodstream through your lungs. Nicotine absorbed through the lungs is capable of creating a powerful addiction. Both inhaled and non-inhaled nicotine may be addictive.  Addiction studies of cigarettes and spit tobacco show that addiction to nicotine occurs mainly during the teen years, when young people begin using tobacco products. WHAT ARE THE BENEFITS OF QUITTING?  There are many health benefits to quitting smoking.   Likelihood of developing cancer and heart disease decreases. Health improvements are seen almost immediately.  Blood pressure, pulse rate, and breathing patterns start returning to normal soon after quitting. QUITTING SMOKING   American Lung Association - 1-800-LUNGUSA  American Cancer Society - 1-800-ACS-2345 Document Released: 02/12/2004 Document Revised: 03/29/2011 Document Reviewed: 10/16/2008 [facility] Patient Information 2013 [place].   Stress Management Stress is a state of physical or mental tension that often results from changes in your life or normal routine. Some common causes of stress are:  Death of a loved one.  Injuries or severe illnesses.  Getting fired or changing jobs.  Moving into a new home. Other causes may be:  Sexual problems.  Business or financial losses.  Taking on a large debt.  Regular conflict with someone at home or at work.  Constant tiredness from lack of sleep. It is not just bad things that are stressful. It may be stressful to:  Win the lottery.  Get married.  Buy a new car. The amount of stress that can be easily tolerated varies from person to person. Changes generally cause stress, regardless of the types of change. Too much stress can affect your health. It may lead to physical or emotional problems. Too little stress (boredom) may also become stressful. SUGGESTIONS TO  REDUCE STRESS:  Talk things over with your family and friends. It often is helpful to share your concerns and worries. If you feel your problem is serious, you may want to get help from a professional counselor.  Consider your problems one at a time instead of lumping them all together. Trying to take care of everything at once may seem impossible. List all the things you need to do and then start with the most important one. Set a goal to accomplish 2 or 3 things each day. If you expect to do too many in a single day you will naturally fail, causing you to feel even more stressed.  Do not use alcohol or drugs to relieve stress. Although you may feel better for a short time, they do not remove the problems that caused the stress. They can also be habit forming.  Exercise regularly - at least 3 times per week. Physical exercise can help to relieve that "uptight" feeling and will relax you.  The shortest distance between despair and hope is often a good night's sleep.  Go to bed and get up on time allowing yourself time for appointments without being rushed.  Take a short "time-out" period from any stressful situation that occurs during the day. Close your eyes and take some deep breaths. Starting with the muscles in your face, tense them, hold it for a few seconds, then relax. Repeat this with the muscles in your neck, shoulders, hand, stomach, back and legs.  Take good care of yourself. Eat a balanced diet and get plenty of rest.  Schedule time for having fun. Take a break from your daily routine to relax. HOME CARE INSTRUCTIONS   Call if you feel overwhelmed by your problems and feel you can no longer manage them on your own.  Return immediately if you feel like hurting yourself or someone else. Document Released: 06/30/2000 Document Revised: 03/29/2011 Document Reviewed: 02/20/2007 [facility] Patient Information 2013 [place].  Ethinyl Estradiol; Etonogestrel vaginal ring What is  this medicine? ETHINYL ESTRADIOL; ETONOGESTREL (ETH in il es tra DYE ole; et oh noe JES trel) vaginal ring is a flexible, vaginal ring used as a contraceptive (birth control method). This medicine combines two types of female hormones, an estrogen and a progestin. This ring is used to prevent ovulation and pregnancy. Each ring is effective for one month. This medicine may be used for other purposes; ask your health care provider or pharmacist if you have questions. COMMON BRAND NAME(S): NuvaRing What should I tell my health care  provider before I take this medicine? They need to know if you have or ever had any of these conditions: -abnormal vaginal bleeding -blood vessel disease or blood clots -breast, cervical, endometrial, ovarian, liver, or uterine cancer -diabetes -gallbladder disease -heart disease or recent heart attack -high blood pressure -high cholesterol -kidney disease -liver disease -migraine headaches -stroke -systemic lupus erythematosus (SLE) -tobacco smoker -an unusual or allergic reaction to estrogens, progestins, other medicines, foods, dyes, or preservatives -pregnant or trying to get pregnant -breast-feeding How should I use this medicine? Insert the ring into your vagina as directed. Follow the directions on the prescription label. The ring will remain place for 3 weeks and is then removed for a 1-week break. A new ring is inserted 1 week after the last ring was removed, on the same day of the week. Do not use more often than directed. A patient package insert for the product will be given with each prescription and refill. Read this sheet carefully each time. The sheet may change frequently. Contact your pediatrician regarding the use of this medicine in children. Special care may be needed. This medicine has been used in female children who have started having menstrual periods. Overdosage: If you think you have taken too much of this medicine contact a poison  control center or emergency room at once. NOTE: This medicine is only for you. Do not share this medicine with others. What if I miss a dose? You will need to replace your vaginal ring once a month as directed. If the ring should slip out, or if you leave it in longer or shorter than you should, contact your health care professional for advice. What may interact with this medicine? -acetaminophen -antibiotics or medicines for infections, especially rifampin, rifabutin, rifapentine, and griseofulvin, and possibly penicillins or tetracyclines -aprepitant -ascorbic acid (vitamin C) -atorvastatin -barbiturate medicines, such as phenobarbital -bosentan -carbamazepine -caffeine -clofibrate -cyclosporine -dantrolene -doxercalciferol -felbamate -grapefruit juice -hydrocortisone -medicines for anxiety or sleeping problems, such as diazepam or temazepam -medicines for diabetes, including pioglitazone -modafinil -mycophenolate -nefazodone -oxcarbazepine -phenytoin -prednisolone -ritonavir or other medicines for HIV infection or AIDS -rosuvastatin -selegiline -soy isoflavones supplements -St. John's wort -tamoxifen or raloxifene -theophylline -thyroid hormones -topiramate -warfarin This list may not describe all possible interactions. Give your health care provider a list of all the medicines, herbs, non-prescription drugs, or dietary supplements you use. Also tell them if you smoke, drink alcohol, or use illegal drugs. Some items may interact with your medicine. What should I watch for while using this medicine? Visit your doctor or health care professional for regular checks on your progress. You will need a regular breast and pelvic exam and Pap smear while on this medicine. Use an additional method of contraception during the first cycle that you use this ring. If you have any reason to think you are pregnant, stop using this medicine right away and contact your doctor or health  care professional. If you are using this medicine for hormone related problems, it may take several cycles of use to see improvement in your condition. Smoking increases the risk of getting a blood clot or having a stroke while you are using hormonal birth control, especially if you are more than 30 years old. You are strongly advised not to smoke. This medicine can make your body retain fluid, making your fingers, hands, or ankles swell. Your blood pressure can go up. Contact your doctor or health care professional if you feel you are retaining fluid. This medicine can make   you more sensitive to the sun. Keep out of the sun. If you cannot avoid being in the sun, wear protective clothing and use sunscreen. Do not use sun lamps or tanning beds/booths. If you wear contact lenses and notice visual changes, or if the lenses begin to feel uncomfortable, consult your eye care specialist. In some women, tenderness, swelling, or minor bleeding of the gums may occur. Notify your dentist if this happens. Brushing and flossing your teeth regularly may help limit this. See your dentist regularly and inform your dentist of the medicines you are taking. If you are going to have elective surgery, you may need to stop using this medicine before the surgery. Consult your health care professional for advice. This medicine does not protect you against HIV infection (AIDS) or any other sexually transmitted diseases. What side effects may I notice from receiving this medicine? Side effects that you should report to your doctor or health care professional as soon as possible: -breast tissue changes or discharge -changes in vaginal bleeding during your period or between your periods -chest pain -coughing up blood -dizziness or fainting spells -headaches or migraines -leg, arm or groin pain -severe or sudden headaches -stomach pain (severe) -sudden shortness of breath -sudden loss of coordination, especially on one  side of the body -speech problems -symptoms of vaginal infection like itching, irritation or unusual discharge -tenderness in the upper abdomen -vomiting -weakness or numbness in the arms or legs, especially on one side of the body -yellowing of the eyes or skin Side effects that usually do not require medical attention (report to your doctor or health care professional if they continue or are bothersome): -breakthrough bleeding and spotting that continues beyond the 3 initial cycles of pills -breast tenderness -mood changes, anxiety, depression, frustration, anger, or emotional outbursts -increased sensitivity to sun or ultraviolet light -nausea -skin rash, acne, or brown spots on the skin -weight gain (slight) This list may not describe all possible side effects. Call your doctor for medical advice about side effects. You may report side effects to FDA at 1-800-FDA-1088. Where should I keep my medicine? Keep out of the reach of children. Store at room temperature between 15 and 30 degrees C (59 and 86 degrees F) for up to 4 months. The product will expire after 4 months. Protect from light. Throw away any unused medicine after the expiration date. NOTE: This sheet is a summary. It may not cover all possible information. If you have questions about this medicine, talk to your doctor, pharmacist, or health care provider.  2015, [employer]. (2007-12-21 12:03:58)  

## 2014-06-04 NOTE — Progress Notes (Signed)
Reviewed personally.  M. Suzanne Ameirah Khatoon, MD.  

## 2014-06-04 NOTE — Progress Notes (Signed)
29 y.o. G0P0000 Divorced  Caucasian Fe here for annual exam.  Periods normal, no issues.Contraception working well. Thyroid evaluated with slight low levels, no treated indicated. No partner change, no STD concerns.  Sees PCP prn and endocrine regarding thyroid. Concerned about weight gain, and would like to change contraception to see if this changes her status. Interested in [place]. Planning trip to [place] soon! No other health issues today.  Patient's last menstrual period was 05/12/2014.          Sexually active: Yes.    The current method of family planning is OCP (estrogen/progesterone).    Exercising: Yes.    interval training,cardio,weights Smoker:  no  Health Maintenance: Pap:  05-30-13 ASCUS HPV HR detected, colpo 06-26-13 LGSIL with neg ECC MMG:  none Colonoscopy:  none BMD:   none TDaP:  2014 Labs: Poct urine-neg Self breast exam: not done   reports that she has never smoked. She has never used smokeless tobacco. She reports that she drinks about 0.5 - 1.0 oz of alcohol per week. She reports that she does not use illicit drugs.  Past Medical History  Diagnosis Date  . Insomnia   . Kidney stone   . Abnormal Pap smear of cervix 05/29/13    ASCUS +HPV HR    Past Surgical History  Procedure Laterality Date  . Tonsillectomy    . Kidney stones      Current Outpatient Prescriptions  Medication Sig Dispense Refill  . desonide (DESOWEN) 0.05 % cream Apply topically 2 (two) times daily. 30 g 0  . GIANVI 3-0.02 MG tablet TAKE ONE TABLET BY MOUTH ONE TIME DAILY  28 tablet 11  . omeprazole (PRILOSEC) 20 MG capsule Take 20 mg by mouth daily.     No current facility-administered medications for this visit.    Family History  Problem Relation Age of Onset  . Crohn's disease Mother   . Hypertension Mother   . Kidney disease Mother   . Cancer Mother     female cancer  . Cancer Father     kidney  . Alzheimer's disease Maternal Grandfather   . Parkinson's disease Paternal  Grandfather     ROS:  Pertinent items are noted in HPI.  Otherwise, a comprehensive ROS was negative.  Exam:   BP 110/70 mmHg  Pulse 70  Resp 20  Ht 4' 11.75" (1.518 m)  Wt 145 lb (65.772 kg)  BMI 28.54 kg/m2  LMP 05/12/2014 Height: 4' 11.75" (151.8 cm) Ht Readings from Last 3 Encounters:  06/04/14 4' 11.75" (1.518 m)  01/29/14 5' (1.524 m)  11/04/13 5\' 1"  (1.549 m)    General appearance: alert, cooperative and appears stated age Head: Normocephalic, without obvious abnormality, atraumatic Neck: no adenopathy, supple, symmetrical, trachea midline and thyroid normal to inspection and palpation Lungs: clear to auscultation bilaterally Breasts: normal appearance, no masses or tenderness, No nipple retraction or dimpling, No nipple discharge or bleeding, No axillary or supraclavicular adenopathy Heart: regular rate and rhythm Abdomen: soft, non-tender; no masses,  no organomegaly Extremities: extremities normal, atraumatic, no cyanosis or edema Skin: Skin color, texture, turgor normal. No rashes or lesions Lymph nodes: Cervical, supraclavicular, and axillary nodes normal. No abnormal inguinal nodes palpated Neurologic: Grossly normal   Pelvic: External genitalia:  no lesions              Urethra:  normal appearing urethra with no masses, tenderness or lesions              Bartholin's  and Skene's: normal                 Vagina: normal appearing vagina with normal color and discharge, no lesions              Cervix: normal, non tender,no lesions              Pap taken: Yes.   Bimanual Exam:  Uterus:  normal size, contour, position, consistency, mobility, non-tender              Adnexa: normal adnexa and no mass, fullness, tenderness               Rectovaginal: Confirms               Anus:  normal sphincter tone, no lesions  Chaperone present: Yes  A:  Well Woman with normal exam  Contraception OCP desires change to Nuvaring trial  Pap smear follow up from ASCUS + HPV with  CIN1 on  colpo  P:   Reviewed health and wellness pertinent to exam  Reviewed risks and benefits of Nuvaring, insertion and removal. Patient inserted and removed without problems.  Rx Nuvaring to start after trip, see order, Rx Gianvi one pack only will advise if problems with Nuvaring  If pap smear is negative repeat one year, if not per results  Pap smear taken today with HPVHR   counseled on breast self exam, STD prevention, HIV risk factors and prevention, adequate intake of calcium and vitamin D, diet and exercise  return annually or prn  An After Visit Summary was printed and given to the patient.

## 2014-06-06 LAB — IPS PAP TEST WITH HPV

## 2015-01-07 ENCOUNTER — Ambulatory Visit (INDEPENDENT_AMBULATORY_CARE_PROVIDER_SITE_OTHER): Payer: BLUE CROSS/BLUE SHIELD | Admitting: Physician Assistant

## 2015-01-07 ENCOUNTER — Encounter: Payer: Self-pay | Admitting: Physician Assistant

## 2015-01-07 VITALS — BP 115/63 | HR 66 | Temp 98.1°F | Resp 16 | Ht 60.5 in | Wt 157.0 lb

## 2015-01-07 DIAGNOSIS — Z0000 Encounter for general adult medical examination without abnormal findings: Secondary | ICD-10-CM | POA: Diagnosis not present

## 2015-01-07 DIAGNOSIS — Z13220 Encounter for screening for lipoid disorders: Secondary | ICD-10-CM | POA: Diagnosis not present

## 2015-01-07 DIAGNOSIS — R635 Abnormal weight gain: Secondary | ICD-10-CM

## 2015-01-07 DIAGNOSIS — Z1321 Encounter for screening for nutritional disorder: Secondary | ICD-10-CM

## 2015-01-07 DIAGNOSIS — R7989 Other specified abnormal findings of blood chemistry: Secondary | ICD-10-CM

## 2015-01-07 LAB — CBC
HCT: 39.5 % (ref 36.0–46.0)
HEMOGLOBIN: 13.1 g/dL (ref 12.0–15.0)
MCH: 27.8 pg (ref 26.0–34.0)
MCHC: 33.2 g/dL (ref 30.0–36.0)
MCV: 83.7 fL (ref 78.0–100.0)
MPV: 10.5 fL (ref 8.6–12.4)
Platelets: 275 10*3/uL (ref 150–400)
RBC: 4.72 MIL/uL (ref 3.87–5.11)
RDW: 14 % (ref 11.5–15.5)
WBC: 8.7 10*3/uL (ref 4.0–10.5)

## 2015-01-07 LAB — COMPREHENSIVE METABOLIC PANEL
ALT: 42 U/L — ABNORMAL HIGH (ref 6–29)
AST: 25 U/L (ref 10–30)
Albumin: 4.3 g/dL (ref 3.6–5.1)
Alkaline Phosphatase: 107 U/L (ref 33–115)
BILIRUBIN TOTAL: 0.7 mg/dL (ref 0.2–1.2)
BUN: 11 mg/dL (ref 7–25)
CALCIUM: 9.5 mg/dL (ref 8.6–10.2)
CO2: 24 mmol/L (ref 20–31)
CREATININE: 0.53 mg/dL (ref 0.50–1.10)
Chloride: 102 mmol/L (ref 98–110)
Glucose, Bld: 85 mg/dL (ref 65–99)
Potassium: 3.8 mmol/L (ref 3.5–5.3)
SODIUM: 137 mmol/L (ref 135–146)
TOTAL PROTEIN: 6.9 g/dL (ref 6.1–8.1)

## 2015-01-07 LAB — LIPID PANEL
Cholesterol: 189 mg/dL (ref 125–200)
HDL: 104 mg/dL (ref >=46)
LDL Cholesterol: 77 mg/dL (ref <130)
Total CHOL/HDL Ratio: 1.8 Ratio (ref <=5.0)
Triglycerides: 42 mg/dL (ref <150)
VLDL: 8 mg/dL (ref <30)

## 2015-01-07 NOTE — Patient Instructions (Addendum)
Continue with diet and exercise I will call you with your lab tests. Start back on cranberry pills to prevent UTIs Return as needed.

## 2015-01-07 NOTE — Progress Notes (Signed)
   Subjective:    Patient ID: Taylor CanterburyBrittany Yassin, female    DOB: July 05, 1985, 29 y.o.   MRN: [id number]  HPI    Review of Systems  Constitutional: Negative.   HENT: Negative.   Eyes: Negative.   Respiratory: Negative.   Cardiovascular: Negative.   Gastrointestinal: Negative.   Endocrine: Negative.   Genitourinary: Negative.   Musculoskeletal: Negative.   Skin: Negative.   Allergic/Immunologic: Negative.   Neurological: Negative.   Hematological: Negative.   Psychiatric/Behavioral: Negative.        Objective:   Physical Exam        Assessment & Plan:

## 2015-01-07 NOTE — Progress Notes (Signed)
[facility] [address], [place] [address] [phone]  Date:  01/07/2015   Name:  Taylor Carey   DOB:  03/19/1985   MRN:  [id number]  PCP:  No PCP Per Patient    Chief Complaint: Annual Exam   History of Present Illness:  This is a 29 y.o. female with PMH recurrent UTIs who is presenting for CPE.  Pt usually gets UTIs 4-5 times a year. She has not gotten one in almost 1 year. She started taking probiotics and apple cider vinegar pills. She was taking cranberry pills as well but stopped because she ran out.  Pt worried about her weight gain. She has gained about 25 pounds gradually over the past 2 years. She states she exercises 6 days a week and eats healthy. She does a metabolic training class 3-4 times a week in the mornings and meets with a trainer twice a week in the evenings. She has had low t3 in the past, thyroid panel has otherwise been normal. She was sent to an endocrinologist who told her there was nothing that needed to be done. She states "it was a waste of my time".  LMP: 12/16/14 Contraception: stopped nuvaring last month d/t weight gain. She was on yaz before that but experience weight gain with that as well. She is sexually active with a female partner and they are not currently using anything for contraception. She states "that's a conversation we need to have". Last pap: 05/2014 - negative. Year before had asc-us. Followed by Leota Sauers, CNM at [facility] Plan to get another pap 05/2015. Immunizations: tdap 2014, declined flu Dentist: regular visits Eye: no change in vision, doesn't wear glasses or contacts Fam hx: mother with crohn's, kidney disease, glaucoma, ovarian cancer dx'd age 75s.. Dad with kidney cancer dx'd mid 30s. No DM2, CAD, CVA. No breast cancer and no one else in family with ovarian cancer. Tobacco/alcohol/substance use: no/1-2 a week/no  Review of Systems:  Review of Systems See CMA note  Patient Active  Problem List   Diagnosis Date Noted  . Recurrent UTI (urinary tract infection) 01/29/2014  . Insomnia    Prior to Admission medications   Medication Sig Start Date End Date Taking? Authorizing Provider  desonide (DESOWEN) 0.05 % cream Apply topically 2 (two) times daily. 05/02/13  Yes Tonye Pearson, MD  omeprazole (PRILOSEC) 20 MG capsule Take 20 mg by mouth daily.   Yes Historical Provider, MD    Allergies  Allergen Reactions  . Bactrim [Sulfamethoxazole-Trimethoprim] Nausea Only  . Gardasil [Hpv Vaccine Recombinant (Yeast Derived)]     questionable  . Macrobid [Nitrofurantoin]     Chest tightness    Past Surgical History  Procedure Laterality Date  . Tonsillectomy    . Kidney stones      Social History  Substance Use Topics  . Smoking status: Never Smoker   . Smokeless tobacco: Never Used  . Alcohol Use: 0.5 - 1.0 oz/week    1-2 drink(s) per week     Comment: social    Family History  Problem Relation Age of Onset  . Crohn's disease Mother   . Hypertension Mother   . Kidney disease Mother   . Cancer Mother     female cancer  . Cancer Father     kidney  . Alzheimer's disease Maternal Grandfather   . Parkinson's disease Paternal Grandfather     Medication list has been reviewed and updated.  Physical Examination:  Physical Exam  Constitutional: She is oriented to person, place, and time.  HENT:  Head: Normocephalic and atraumatic.  Right Ear: Hearing, tympanic membrane, external ear and ear canal normal.  Left Ear: Hearing, tympanic membrane, external ear and ear canal normal.  Nose: Nose normal.  Mouth/Throat: Uvula is midline, oropharynx is clear and moist and mucous membranes are normal.  Eyes: Conjunctivae, EOM and lids are normal. Right eye exhibits no discharge. Left eye exhibits no discharge. No scleral icterus.  Neck: Trachea normal. Carotid bruit is not present. No thyromegaly present.  Cardiovascular: Normal rate, regular rhythm, normal  heart sounds, intact distal pulses and normal pulses.   No murmur heard. Pulmonary/Chest: Effort normal and breath sounds normal. She has no wheezes. She has no rhonchi. She has no rales.  Breast exam deferred, performed at GYN  Abdominal: Soft. Normal appearance. There is no tenderness.  Musculoskeletal: Normal range of motion.  Lymphadenopathy:       Head (right side): No submental, no submandibular, no tonsillar, no preauricular, no posterior auricular and no occipital adenopathy present.       Head (left side): No submental, no submandibular, no tonsillar, no preauricular, no posterior auricular and no occipital adenopathy present.    She has no cervical adenopathy.       Right: No supraclavicular adenopathy present.       Left: No supraclavicular adenopathy present.  Neurological: She is alert and oriented to person, place, and time. She has normal strength and normal reflexes. No cranial nerve deficit or sensory deficit. Coordination and gait normal.  Skin: Skin is warm, dry and intact. No lesion and no rash noted.  Psychiatric: She has a normal mood and affect. Her speech is normal and behavior is normal. Thought content normal.   BP 115/63 mmHg  Pulse 66  Temp(Src) 98.1 F (36.7 C)  Resp 16  Ht 5' 0.5" (1.537 m)  Wt 157 lb (71.215 kg)  BMI 30.15 kg/m2  LMP 12/16/2014  Assessment and Plan:  1. Annual physical exam Up to date on preventative screenings. Declined flu Pap and breast exam at GYN. History asc-us, yearly paps for now. Return as needed. - CBC  2. Weight gain 3. T3 low in serum Weight gain despite healthy eating and exercise 6 days a week. History low t3 in the past. CMP and thyroid panel today. - Comprehensive metabolic panel - Thyroid Panel With TSH  4. Lipid screening - Lipid panel  5. Encounter for vitamin deficiency screening - Vitamin D 1,25 dihydroxy   Roswell MinersNicole V. Dyke BrackettBush, PA-C, MHS [facility]  01/07/2015

## 2015-01-08 LAB — THYROID PANEL WITH TSH
Free Thyroxine Index: 1.7 (ref 1.4–3.8)
T3 UPTAKE: 29 % (ref 22–35)
T4 TOTAL: 6 ug/dL (ref 4.5–12.0)
TSH: 2.486 u[IU]/mL (ref 0.350–4.500)

## 2015-01-11 LAB — VITAMIN D 1,25 DIHYDROXY
VITAMIN D 1, 25 (OH) TOTAL: 76 pg/mL — AB (ref 18–72)
VITAMIN D3 1, 25 (OH): 76 pg/mL

## 2015-06-06 ENCOUNTER — Encounter: Payer: Self-pay | Admitting: Certified Nurse Midwife

## 2015-06-06 ENCOUNTER — Ambulatory Visit (INDEPENDENT_AMBULATORY_CARE_PROVIDER_SITE_OTHER): Payer: BLUE CROSS/BLUE SHIELD | Admitting: Certified Nurse Midwife

## 2015-06-06 VITALS — BP 100/62 | HR 70 | Resp 16 | Ht 60.25 in | Wt 159.0 lb

## 2015-06-06 DIAGNOSIS — Z01419 Encounter for gynecological examination (general) (routine) without abnormal findings: Secondary | ICD-10-CM | POA: Diagnosis not present

## 2015-06-06 DIAGNOSIS — Z30011 Encounter for initial prescription of contraceptive pills: Secondary | ICD-10-CM

## 2015-06-06 DIAGNOSIS — N912 Amenorrhea, unspecified: Secondary | ICD-10-CM

## 2015-06-06 DIAGNOSIS — Z124 Encounter for screening for malignant neoplasm of cervix: Secondary | ICD-10-CM | POA: Diagnosis not present

## 2015-06-06 DIAGNOSIS — R899 Unspecified abnormal finding in specimens from other organs, systems and tissues: Secondary | ICD-10-CM | POA: Diagnosis not present

## 2015-06-06 LAB — ALT: ALT: 12 U/L (ref 6–29)

## 2015-06-06 LAB — POCT URINE PREGNANCY: Preg Test, Ur: NEGATIVE

## 2015-06-06 MED ORDER — NORETHIN ACE-ETH ESTRAD-FE 1-20 MG-MCG(24) PO TABS: 1.0000 | ORAL_TABLET | Freq: Every day | ORAL | Status: DC

## 2015-06-06 NOTE — Progress Notes (Signed)
30 y.o. G0P0000 Divorced  Caucasian Fe here for annual exam. Periods normal,no issues. Would like to start back on OCP, maybe interested in IUD in future. Getting married in 9/17. No STD concerns or health issues today. Had elevated ALT at PCP and would like to have checked here if possible. Has not taken in OTC medications in several weeks. Planning a beach wedding!  Patient's last menstrual period was 05/08/2015.          Sexually active: Yes.    The current method of family planning is condoms most of the time.    Exercising: Yes.    cardio Smoker:  no  Health Maintenance: Pap: 06-04-14 neg HPV HR neg, hx of abnormal 2015 MMG:  none Colonoscopy:  none BMD:   none TDaP:  2014 Shingles: no Pneumonia: no Hep C and HIV: neg 2012 Labs: upt neg Self breast exam: not done   reports that she has never smoked. She has never used smokeless tobacco. She reports that she drinks about 0.6 oz of alcohol per week. She reports that she does not use illicit drugs.  Past Medical History  Diagnosis Date  . Insomnia   . Kidney stone   . Abnormal Pap smear of cervix 05/29/13    ASCUS +HPV HR    Past Surgical History  Procedure Laterality Date  . Tonsillectomy    . Kidney stones      Current Outpatient Prescriptions  Medication Sig Dispense Refill  . omeprazole (PRILOSEC) 20 MG capsule Take 20 mg by mouth daily.     No current facility-administered medications for this visit.    Family History  Problem Relation Age of Onset  . Crohn's disease Mother   . Hypertension Mother   . Kidney disease Mother   . Cancer Mother     female cancer  . Glaucoma Mother   . Cancer Father     kidney  . Alzheimer's disease Maternal Grandfather   . Parkinson's disease Paternal Grandfather     ROS:  Pertinent items are noted in HPI.  Otherwise, a comprehensive ROS was negative.  Exam:   BP 100/62 mmHg  Pulse 70  Resp 16  Ht 5' 0.25" (1.53 m)  Wt 159 lb (72.122 kg)  BMI 30.81 kg/m2  LMP  05/08/2015 Height: 5' 0.25" (153 cm) Ht Readings from Last 3 Encounters:  06/06/15 5' 0.25" (1.53 m)  01/07/15 5' 0.5" (1.537 m)  06/04/14 4' 11.75" (1.518 m)    General appearance: alert, cooperative and appears stated age Head: Normocephalic, without obvious abnormality, atraumatic Neck: no adenopathy, supple, symmetrical, trachea midline and thyroid normal to inspection and palpation Lungs: clear to auscultation bilaterally Breasts: normal appearance, no masses or tenderness, No nipple retraction or dimpling, No nipple discharge or bleeding, No axillary or supraclavicular adenopathy Heart: regular rate and rhythm Abdomen: soft, non-tender; no masses,  no organomegaly Extremities: extremities normal, atraumatic, no cyanosis or edema Skin: Skin color, texture, turgor normal. No rashes or lesions Lymph nodes: Cervical, supraclavicular, and axillary nodes normal. No abnormal inguinal nodes palpated Neurologic: Grossly normal   Pelvic: External genitalia:  no lesions              Urethra:  normal appearing urethra with no masses, tenderness or lesions              Bartholin's and Skene's: normal                 Vagina: normal appearing vagina with normal color and  discharge, no lesions              Cervix: no cervical motion tenderness, no lesions and nulliparous appearance              Pap taken: Yes.   Bimanual Exam:  Uterus:  normal size, contour, position, consistency, mobility, non-tender and anteverted              Adnexa: normal adnexa and no mass, fullness, tenderness               Rectovaginal: Confirms               Anus:  normal sphincter tone, no lesions  Chaperone present: yes  A:  Well Woman with normal exam  Contraception change desires OCP again  Screening lab    P:   Reviewed health and wellness pertinent to exam  Discussed risks and benefits/warning signs of OCP. Questions addressed. Given IUD information for future.  Rx Loestrin 24 Fe see order with  instructions to start on first day of menses  Patient was to suppose to have ALT rechecked at PCP request here today. Lab in Epic. Will need to follow up with PCP if still elevated, patient agrees. Lab: ALT  Pap smear as above with HPV reflex   counseled on breast self exam, STD prevention, HIV risk factors and prevention, use and side effects of OCP's, adequate intake of calcium and vitamin D, diet and exercise  return annually or prn  An After Visit Summary was printed and given to the patient.

## 2015-06-06 NOTE — Patient Instructions (Signed)
EXERCISE AND DIET:  We recommended that you start or continue a regular exercise program for good health. Regular exercise means any activity that makes your heart beat faster and makes you sweat.  We recommend exercising at least 30 minutes per day at least 3 days a week, preferably 4 or 5.  We also recommend a diet low in fat and sugar.  Inactivity, poor dietary choices and obesity can cause diabetes, heart attack, stroke, and kidney damage, among others.    ALCOHOL AND SMOKING:  Women should limit their alcohol intake to no more than 7 drinks/beers/glasses of wine (combined, not each!) per week. Moderation of alcohol intake to this level decreases your risk of breast cancer and liver damage. And of course, no recreational drugs are part of a healthy lifestyle.  And absolutely no smoking or even second hand smoke. Most people know smoking can cause heart and lung diseases, but did you know it also contributes to weakening of your bones? Aging of your skin?  Yellowing of your teeth and nails?  CALCIUM AND VITAMIN D:  Adequate intake of calcium and Vitamin D are recommended.  The recommendations for exact amounts of these supplements seem to change often, but generally speaking 600 mg of calcium (either carbonate or citrate) and 800 units of Vitamin D per day seems prudent. Certain women may benefit from higher intake of Vitamin D.  If you are among these women, your doctor will have told you during your visit.    PAP SMEARS:  Pap smears, to check for cervical cancer or precancers,  have traditionally been done yearly, although recent scientific advances have shown that most women can have pap smears less often.  However, every woman still should have a physical exam from her gynecologist every year. It will include a breast check, inspection of the vulva and vagina to check for abnormal growths or skin changes, a visual exam of the cervix, and then an exam to evaluate the size and shape of the uterus and  ovaries.  And after 31 years of age, a rectal exam is indicated to check for rectal cancers. We will also provide age appropriate advice regarding health maintenance, like when you should have certain vaccines, screening for sexually transmitted diseases, bone density testing, colonoscopy, mammograms, etc.   MAMMOGRAMS:  All women over 40 years old should have a yearly mammogram. Many facilities now offer a "3D" mammogram, which may cost around $50 extra out of pocket. If possible,  we recommend you accept the option to have the 3D mammogram performed.  It both reduces the number of women who will be called back for extra views which then turn out to be normal, and it is better than the routine mammogram at detecting truly abnormal areas.    COLONOSCOPY:  Colonoscopy to screen for colon cancer is recommended for all women at age 50.  We know, you hate the idea of the prep.  We agree, BUT, having colon cancer and not knowing it is worse!!  Colon cancer so often starts as a polyp that can be seen and removed at colonscopy, which can quite literally save your life!  And if your first colonoscopy is normal and you have no family history of colon cancer, most women don't have to have it again for 10 years.  Once every ten years, you can do something that may end up saving your life, right?  We will be happy to help you get it scheduled when you are ready.    Be sure to check your insurance coverage so you understand how much it will cost.  It may be covered as a preventative service at no cost, but you should check your particular policy.     Oral Contraception Use Oral contraceptive pills (OCPs) are medicines taken to prevent pregnancy. OCPs work by preventing the ovaries from releasing eggs. The hormones in OCPs also cause the cervical mucus to thicken, preventing the sperm from entering the uterus. The hormones also cause the uterine lining to become thin, not allowing a fertilized egg to attach to the inside of  the uterus. OCPs are highly effective when taken exactly as prescribed. However, OCPs do not prevent sexually transmitted diseases (STDs). Safe sex practices, such as using condoms along with an OCP, can help prevent STDs. Before taking OCPs, you may have a physical exam and Pap test. Your health care provider may also order blood tests if necessary. Your health care provider will make sure you are a good candidate for oral contraception. Discuss with your health care provider the possible side effects of the OCP you may be prescribed. When starting an OCP, it can take 2 to 3 months for the body to adjust to the changes in hormone levels in your body.  HOW TO TAKE ORAL CONTRACEPTIVE PILLS Your health care provider may advise you on how to start taking the first cycle of OCPs. Otherwise, you can:   Start on day 1 of your menstrual period. You will not need any backup contraceptive protection with this start time.   Start on the first Sunday after your menstrual period or the day you get your prescription. In these cases, you will need to use backup contraceptive protection for the first week.   Start the pill at any time of your cycle. If you take the pill within 5 days of the start of your period, you are protected against pregnancy right away. In this case, you will not need a backup form of birth control. If you start at any other time of your menstrual cycle, you will need to use another form of birth control for 7 days. If your OCP is the type called a minipill, it will protect you from pregnancy after taking it for 2 days (48 hours). After you have started taking OCPs:   If you forget to take 1 pill, take it as soon as you remember. Take the next pill at the regular time.   If you miss 2 or more pills, call your health care provider because different pills have different instructions for missed doses. Use backup birth control until your next menstrual period starts.   If you use a 28-day  pack that contains inactive pills and you miss 1 of the last 7 pills (pills with no hormones), it will not matter. Throw away the rest of the non-hormone pills and start a new pill pack.  No matter which day you start the OCP, you will always start a new pack on that same day of the week. Have an extra pack of OCPs and a backup contraceptive method available in case you miss some pills or lose your OCP pack.  HOME CARE INSTRUCTIONS   Do not smoke.   Always use a condom to protect against STDs. OCPs do not protect against STDs.   Use a calendar to mark your menstrual period days.   Read the information and directions that came with your OCP. Talk to your health care provider if you have questions.  SEEK MEDICAL CARE IF:   You develop nausea and vomiting.   You have abnormal vaginal discharge or bleeding.   You develop a rash.   You miss your menstrual period.   You are losing your hair.   You need treatment for mood swings or depression.   You get dizzy when taking the OCP.   You develop acne from taking the OCP.   You become pregnant.  SEEK IMMEDIATE MEDICAL CARE IF:   You develop chest pain.   You develop shortness of breath.   You have an uncontrolled or severe headache.   You develop numbness or slurred speech.   You develop visual problems.   You develop pain, redness, and swelling in the legs.    This information is not intended to replace advice given to you by your health care provider. Make sure you discuss any questions you have with your health care provider.   Document Released: 12/24/2010 Document Revised: 01/25/2014 Document Reviewed: 06/25/2012 Elsevier Interactive Patient Education 2016 Elsevier Inc. Oral Contraception Information Oral contraceptive pills (OCPs) are medicines taken to prevent pregnancy. OCPs work by preventing the ovaries from releasing eggs. The hormones in OCPs also cause the cervical mucus to thicken, preventing  the sperm from entering the uterus. The hormones also cause the uterine lining to become thin, not allowing a fertilized egg to attach to the inside of the uterus. OCPs are highly effective when taken exactly as prescribed. However, OCPs do not prevent sexually transmitted diseases (STDs). Safe sex practices, such as using condoms along with the pill, can help prevent STDs.  Before taking the pill, you may have a physical exam and Pap test. Your health care provider may order blood tests. The health care provider will make sure you are a good candidate for oral contraception. Discuss with your health care provider the possible side effects of the OCP you may be prescribed. When starting an OCP, it can take 2 to 3 months for the body to adjust to the changes in hormone levels in your body.  TYPES OF ORAL CONTRACEPTION  The combination pill--This pill contains estrogen and progestin (synthetic progesterone) hormones. The combination pill comes in 21-day, 28-day, or 91-day packs. Some types of combination pills are meant to be taken continuously (365-day pills). With 21-day packs, you do not take pills for 7 days after the last pill. With 28-day packs, the pill is taken every day. The last 7 pills are without hormones. Certain types of pills have more than 21 hormone-containing pills. With 91-day packs, the first 84 pills contain both hormones, and the last 7 pills contain no hormones or contain estrogen only.  The minipill--This pill contains the progesterone hormone only. The pill is taken every day continuously. It is very important to take the pill at the same time each day. The minipill comes in packs of 28 pills. All 28 pills contain the hormone.  ADVANTAGES OF ORAL CONTRACEPTIVE PILLS  Decreases premenstrual symptoms.   Treats menstrual period cramps.   Regulates the menstrual cycle.   Decreases a heavy menstrual flow.   May treatacne, depending on the type of pill.   Treats abnormal  uterine bleeding.   Treats polycystic ovarian syndrome.   Treats endometriosis.   Can be used as emergency contraception.  THINGS THAT CAN MAKE ORAL CONTRACEPTIVE PILLS LESS EFFECTIVE OCPs can be less effective if:   You forget to take the pill at the same time every day.   You have a stomach or   intestinal disease that lessens the absorption of the pill.   You take OCPs with other medicines that make OCPs less effective, such as antibiotics, certain HIV medicines, and some seizure medicines.   You take expired OCPs.   You forget to restart the pill on day 7, when using the packs of 21 pills.  RISKS ASSOCIATED WITH ORAL CONTRACEPTIVE PILLS  Oral contraceptive pills can sometimes cause side effects, such as:  Headache.  Nausea.  Breast tenderness.  Irregular bleeding or spotting. Combination pills are also associated with a small increased risk of:  Blood clots.  Heart attack.  Stroke.   This information is not intended to replace advice given to you by your health care provider. Make sure you discuss any questions you have with your health care provider.   Document Released: 03/27/2002 Document Revised: 10/25/2012 Document Reviewed: 06/25/2012 Elsevier Interactive Patient Education [employer]2016 Elsevier Inc.

## 2015-06-07 NOTE — Progress Notes (Signed)
Reviewed personally.  M. Suzanne Tremond Shimabukuro, MD.  

## 2015-06-09 LAB — IPS PAP TEST WITH REFLEX TO HPV

## 2015-09-05 ENCOUNTER — Telehealth: Payer: Self-pay | Admitting: Certified Nurse Midwife

## 2015-09-05 ENCOUNTER — Other Ambulatory Visit: Payer: Self-pay | Admitting: Certified Nurse Midwife

## 2015-09-05 DIAGNOSIS — Z3041 Encounter for surveillance of contraceptive pills: Secondary | ICD-10-CM

## 2015-09-05 MED ORDER — DROSPIRENONE-ETHINYL ESTRADIOL 3-0.02 MG PO TABS: 1.0000 | ORAL_TABLET | Freq: Every day | ORAL | 9 refills | Status: DC

## 2015-09-05 NOTE — Telephone Encounter (Signed)
I sent order in

## 2015-09-05 NOTE — Telephone Encounter (Signed)
Spoke with patient. Patient states that at her aex in May she was switched from Gianvi OCP to Loestrin 24 Fe. Reports since stating Loestrin 24 Fe she has been more irritable and is having mood swings. Reports she was switched from [place] due to not being able to lose weight. The patient is getting married in 1 month and would like to switch back to [place]. "I would feel more comfortable because I did not have any of these other side effects on the Gianvi." Patient is due to start a new pack of OCP on Sunday 09/07/2015. Pharmacy on file is correct. Advised I will speak with Leota Sauerseborah Leonard CNM regarding medication change and return call. She is agreeable.

## 2015-09-05 NOTE — Telephone Encounter (Signed)
Patient having side effects from her medication and would like to speak with nurse.

## 2015-09-05 NOTE — Telephone Encounter (Signed)
Called patient at [phone] and per DPR, left detailed message stating Kathleen ArgueDeborah Leonard,CNM sent in new RX for [place].

## 2015-09-05 NOTE — Telephone Encounter (Signed)
Ok to switch back? 

## 2016-02-02 ENCOUNTER — Encounter: Payer: Self-pay | Admitting: Obstetrics and Gynecology

## 2016-02-02 ENCOUNTER — Ambulatory Visit (INDEPENDENT_AMBULATORY_CARE_PROVIDER_SITE_OTHER): Payer: BLUE CROSS/BLUE SHIELD | Admitting: Obstetrics and Gynecology

## 2016-02-02 ENCOUNTER — Telehealth: Payer: Self-pay | Admitting: Certified Nurse Midwife

## 2016-02-02 VITALS — BP 122/78 | HR 84 | Resp 16 | Wt 170.0 lb

## 2016-02-02 DIAGNOSIS — N939 Abnormal uterine and vaginal bleeding, unspecified: Secondary | ICD-10-CM | POA: Diagnosis not present

## 2016-02-02 NOTE — Telephone Encounter (Signed)
Spoke with patient. Patient states that she took a UPT 2 weeks ago that was positive. Last week she began to have cramping and light spotting that lasted for a couple days. Took another UPT that was positive. States she is no longer having bleeding, cramping, or any pain. Patient would like to be seen for evaluation. "My breasts used to be sore before the bleeding and now they are not. I think I should be evaluated." Appointment scheduled for this morning at 10:30 am with Dr.Silva. Patient is agreeable to date, time, and to see a different provider as Leota Sauerseborah Leonard CNM is out of the office today.  Cc: Leota Sauerseborah Leonard CNM   Routing to covering provider for final review. Patient agreeable to disposition. Will close encounter.

## 2016-02-02 NOTE — Telephone Encounter (Signed)
Patient had a positive home pregnancy test. She's having bleeding and cramping now.

## 2016-02-02 NOTE — Progress Notes (Signed)
GYNECOLOGY  VISIT   HPI: 31 y.o.   Divorced  Caucasian  female   G0P0000 with Patient's last menstrual period was 12/13/2015.   here for  2 Positive UPT - bleeding x4 days, cramping x2 days.  UPT positive on 01/19/16.  Had bleeding and cramping one week later.  UPT was still positive then.  Bleeding and cramping stopped 5 days ago.  Breast tenderness has subsequently stopped.  Stopped OCPs on 01/19/16.  Had missed a few days.   UPT here:  Negative.  GYNECOLOGIC HISTORY: Patient's last menstrual period was 12/13/2015. Contraception:  Patient was taking OCP until she had positive UPT Menopausal hormone therapy:  n/a Last mammogram:  n/a Last pap smear:  06-06-15 Neg         OB History    Gravida Para Term Preterm AB Living   0 0 0 0 0 0   SAB TAB Ectopic Multiple Live Births   0 0 0 0           Patient Active Problem List   Diagnosis Date Noted  . Recurrent UTI (urinary tract infection) 01/29/2014    Past Medical History:  Diagnosis Date  . Abnormal Pap smear of cervix 05/29/13   ASCUS +HPV HR  . Insomnia   . Kidney stone     Past Surgical History:  Procedure Laterality Date  . kidney stones    . TONSILLECTOMY      Current Outpatient Prescriptions  Medication Sig Dispense Refill  . omeprazole (PRILOSEC) 20 MG capsule Take 20 mg by mouth daily.    . Prenatal Vit-Fe Fumarate-FA (PRENATAL PO) Take 1 tablet by mouth daily.     No current facility-administered medications for this visit.      ALLERGIES: Bactrim [sulfamethoxazole-trimethoprim]; Gardasil [hpv vaccine recombinant (yeast derived)]; and Macrobid [nitrofurantoin]  Family History  Problem Relation Age of Onset  . Crohn's disease Mother   . Hypertension Mother   . Kidney disease Mother   . Cancer Mother     female cancer  . Glaucoma Mother   . Cancer Father     kidney  . Alzheimer's disease Maternal Grandfather   . Parkinson's disease Paternal Grandfather     Social History   Social History   . Marital status: Married    Spouse name: N/A  . Number of children: N/A  . Years of education: N/A   Occupational History  . Not on file.   Social History Main Topics  . Smoking status: Never Smoker  . Smokeless tobacco: Never Used  . Alcohol use 0.6 oz/week    1 Standard drinks or equivalent per week     Comment: social  . Drug use: No  . Sexual activity: Yes    Partners: Male    Birth control/ protection: Condom   Other Topics Concern  . Not on file   Social History Narrative  . No narrative on file    ROS:  Pertinent items are noted in HPI.  PHYSICAL EXAMINATION:    BP 122/78 (BP Location: Right Arm, Patient Position: Sitting, Cuff Size: Normal)   Pulse 84   Resp 16   Wt 170 lb (77.1 kg)   LMP 12/13/2015   BMI 32.93 kg/m     General appearance: alert, cooperative and appears stated age   Pelvic: External genitalia:  no lesions              Urethra:  normal appearing urethra with no masses, tenderness or lesions  Bartholins and Skenes: normal                 Vagina: normal appearing vagina with normal color and discharge, no lesions              Cervix: no lesions                Bimanual Exam:  Uterus:  normal size, contour, position, consistency, mobility, non-tender              Adnexa: no mass, fullness, tenderness             Chaperone was present for exam.  ASSESSMENT  Positive pregnancy test at home.  Negative pregnancy test here. Abnormal uterine bleeding.  Probable early miscarriage.  PLAN  Discussion of early pregnancy loss - aneuploidy as usual cause.  Will check quant beta hCG today.  OK to continue PNV. Discussed alternative forms of contraception if desires to continue with pregnancy prevention.  May want to return to see Sara Chuebbie Leonard for this discussion.  She understands that our office also provides pregnancy confirmation and [facility].    An After Visit Summary was  printed and given to the patient.  ___15___ minutes face to face time of which over 50% was spent in counseling.

## 2016-02-03 LAB — HCG, QUANTITATIVE, PREGNANCY: hCG, Beta Chain, Quant, S: <2 m[IU]/mL

## 2016-02-05 ENCOUNTER — Other Ambulatory Visit: Payer: BLUE CROSS/BLUE SHIELD

## 2016-02-06 ENCOUNTER — Other Ambulatory Visit: Payer: BLUE CROSS/BLUE SHIELD

## 2016-02-09 ENCOUNTER — Other Ambulatory Visit: Payer: BLUE CROSS/BLUE SHIELD

## 2016-06-11 ENCOUNTER — Encounter: Payer: Self-pay | Admitting: Certified Nurse Midwife

## 2016-06-11 ENCOUNTER — Other Ambulatory Visit (HOSPITAL_COMMUNITY)
Admission: RE | Admit: 2016-06-11 | Discharge: 2016-06-11 | Disposition: A | Discharge status: Home / Self Care | Payer: BLUE CROSS/BLUE SHIELD | Source: Ambulatory Visit | Attending: Obstetrics & Gynecology | Admitting: Obstetrics & Gynecology

## 2016-06-11 ENCOUNTER — Ambulatory Visit (INDEPENDENT_AMBULATORY_CARE_PROVIDER_SITE_OTHER): Payer: BLUE CROSS/BLUE SHIELD | Admitting: Certified Nurse Midwife

## 2016-06-11 VITALS — BP 112/70 | HR 68 | Resp 16 | Ht 60.0 in | Wt 172.6 lb

## 2016-06-11 DIAGNOSIS — Z01419 Encounter for gynecological examination (general) (routine) without abnormal findings: Secondary | ICD-10-CM | POA: Diagnosis not present

## 2016-06-11 DIAGNOSIS — Z124 Encounter for screening for malignant neoplasm of cervix: Secondary | ICD-10-CM

## 2016-06-11 DIAGNOSIS — Z8759 Personal history of other complications of pregnancy, childbirth and the puerperium: Secondary | ICD-10-CM | POA: Diagnosis not present

## 2016-06-11 NOTE — Patient Instructions (Signed)

## 2016-06-11 NOTE — Progress Notes (Signed)
31 y.o. G0P0000 Married  Caucasian Fe here for annual exam. Periods normal since miscarriage in 1/18. Now off contraception and not actively trying for pregnancy, but would be happy if occurs. Has not started on prenatal vitamins yet. Was concerned about spouse age of 245 and concerns with genetic problems. No significant family history in either family. Patient is aware of availability of. Emotional at times still after miscarriage, but getting less now. Sees urgent care if needed and is establishing with PCP and will do labs there. Working on eight loss again, has gained back what she has lost. No other health issues today. Planning time with family over holiday!  Patient's last menstrual period was 05/24/2016.          Sexually active: Yes.    The current method of family planning is none.    Exercising: Yes.    Cardio, weight  training Smoker:  no  Health Maintenance: Pap:  06-04-14 neg HPV HR neg, 06-06-15 neg History of Abnormal Pap: yes, 05/30/13 ASCUS; Pos HR HPV; 06/26/13 Colpo, LSIL w/mild dysplasia.  MMG:  none Self Breast exams: no Colonoscopy:  none BMD:   none TDaP:  2014 Shingles: no Pneumonia: no Hep C and HIV: per patient neg 2012 Labs: If needed   reports that she has never smoked. She has never used smokeless tobacco. She reports that she drinks about 0.6 oz of alcohol per week . She reports that she does not use drugs.  Past Medical History:  Diagnosis Date  . Abnormal Pap smear of cervix 05/29/13   ASCUS +HPV HR  . Insomnia   . Kidney stone     Past Surgical History:  Procedure Laterality Date  . kidney stones    . TONSILLECTOMY      No current outpatient prescriptions on file.   No current facility-administered medications for this visit.     Family History  Problem Relation Age of Onset  . Crohn's disease Mother   . Hypertension Mother   . Kidney disease Mother   . Cancer Mother        female cancer  . Glaucoma Mother   . Cancer Father        kidney   . Alzheimer's disease Maternal Grandfather   . Parkinson's disease Paternal Grandfather     ROS:  Pertinent items are noted in HPI.  Otherwise, a comprehensive ROS was negative.  Exam:   BP 112/70 (BP Location: Right Arm, Patient Position: Sitting, Cuff Size: Normal)   Pulse 68   Resp 16   Ht 5' (1.524 m)   Wt 172 lb 9.6 oz (78.3 kg)   LMP 05/24/2016   BMI 33.71 kg/m  Height: 5' (152.4 cm) Ht Readings from Last 3 Encounters:  06/11/16 5' (1.524 m)  06/06/15 5' 0.25" (1.53 m)  01/07/15 5' 0.5" (1.537 m)    General appearance: alert, cooperative and appears stated age Head: Normocephalic, without obvious abnormality, atraumatic Neck: no adenopathy, supple, symmetrical, trachea midline and thyroid normal to inspection and palpation Lungs: clear to auscultation bilaterally Breasts: normal appearance, no masses or tenderness, No nipple retraction or dimpling, No nipple discharge or bleeding, No axillary or supraclavicular adenopathy Heart: regular rate and rhythm Abdomen: soft, non-tender; no masses,  no organomegaly Extremities: extremities normal, atraumatic, no cyanosis or edema Skin: Skin color, texture, turgor normal. No rashes or lesions Lymph nodes: Cervical, supraclavicular, and axillary nodes normal. No abnormal inguinal nodes palpated Neurologic: Grossly normal   Pelvic: External genitalia:  no  lesions              Urethra:  normal appearing urethra with no masses, tenderness or lesions              Bartholin's and Skene's: normal                 Vagina: normal appearing vagina with normal color and discharge, no lesions              Cervix: no bleeding following Pap, no cervical motion tenderness and no lesions              Pap taken: Yes.   Bimanual Exam:  Uterus:  normal size, contour, position, consistency, mobility, non-tender              Adnexa: normal adnexa and no mass, fullness, tenderness               Rectovaginal: Confirms               Anus:  normal  sphincter tone, no lesions  Chaperone present: yes  A:  Well Woman with normal exam  Contraception none, pregnancy anticipated  Obesity on weight loss again  Miscarriage 1/18, emotionally healing now  P:   Reviewed health and wellness pertinent to exam  Discussed importance of good health with weight loss and exercise prior to trying for pregnancy again. Also discussed starting on daily OTC prenatal vitamins now. Offered MFM consult to discuss her genetic risk which is only spouse age of 59, she feels this is a concern. Discussed Rubella screen today, declines. Patient declines at this point. Will call and schedule appointment if +UPT. Questions addressed.  Pap smear: yes  counseled on breast self exam, adequate intake of calcium and vitamin D, diet and exercise  return annually or prn  An After Visit Summary was printed and given to the patient.

## 2016-06-15 LAB — CYTOLOGY - PAP: DIAGNOSIS: NEGATIVE

## 2016-07-01 ENCOUNTER — Ambulatory Visit (INDEPENDENT_AMBULATORY_CARE_PROVIDER_SITE_OTHER): Payer: BLUE CROSS/BLUE SHIELD | Admitting: Physician Assistant

## 2016-07-01 ENCOUNTER — Encounter: Payer: Self-pay | Admitting: Physician Assistant

## 2016-07-01 VITALS — BP 100/62 | HR 73 | Temp 98.1°F | Ht 60.0 in | Wt 165.0 lb

## 2016-07-01 DIAGNOSIS — Z0001 Encounter for general adult medical examination with abnormal findings: Secondary | ICD-10-CM

## 2016-07-01 DIAGNOSIS — R1310 Dysphagia, unspecified: Secondary | ICD-10-CM

## 2016-07-01 DIAGNOSIS — K219 Gastro-esophageal reflux disease without esophagitis: Secondary | ICD-10-CM | POA: Diagnosis not present

## 2016-07-01 DIAGNOSIS — E669 Obesity, unspecified: Secondary | ICD-10-CM | POA: Diagnosis not present

## 2016-07-01 DIAGNOSIS — E559 Vitamin D deficiency, unspecified: Secondary | ICD-10-CM | POA: Diagnosis not present

## 2016-07-01 DIAGNOSIS — Z136 Encounter for screening for cardiovascular disorders: Secondary | ICD-10-CM | POA: Diagnosis not present

## 2016-07-01 DIAGNOSIS — Z13220 Encounter for screening for lipoid disorders: Secondary | ICD-10-CM

## 2016-07-01 DIAGNOSIS — Z114 Encounter for screening for human immunodeficiency virus [HIV]: Secondary | ICD-10-CM

## 2016-07-01 LAB — LIPID PANEL
CHOL/HDL RATIO: 3
Cholesterol: 210 mg/dL — ABNORMAL HIGH (ref 0–200)
HDL: 68.9 mg/dL (ref >39.00)
LDL CALC: 131 mg/dL — AB (ref 0–99)
NONHDL: 140.7
Triglycerides: 47 mg/dL (ref 0.0–149.0)
VLDL: 9.4 mg/dL (ref 0.0–40.0)

## 2016-07-01 LAB — CBC WITH DIFFERENTIAL/PLATELET
BASOS ABS: 0.1 10*3/uL (ref 0.0–0.1)
BASOS PCT: 1 % (ref 0.0–3.0)
EOS ABS: 0.1 10*3/uL (ref 0.0–0.7)
Eosinophils Relative: 2.6 % (ref 0.0–5.0)
HEMATOCRIT: 42.3 % (ref 36.0–46.0)
HEMOGLOBIN: 14 g/dL (ref 12.0–15.0)
LYMPHS PCT: 34.9 % (ref 12.0–46.0)
Lymphs Abs: 1.9 10*3/uL (ref 0.7–4.0)
MCHC: 33.2 g/dL (ref 30.0–36.0)
MCV: 84.2 fl (ref 78.0–100.0)
MONO ABS: 0.5 10*3/uL (ref 0.1–1.0)
Monocytes Relative: 9.4 % (ref 3.0–12.0)
NEUTROS ABS: 2.8 10*3/uL (ref 1.4–7.7)
Neutrophils Relative %: 52.1 % (ref 43.0–77.0)
PLATELETS: 271 10*3/uL (ref 150.0–400.0)
RBC: 5.03 Mil/uL (ref 3.87–5.11)
RDW: 15.1 % (ref 11.5–15.5)
WBC: 5.4 10*3/uL (ref 4.0–10.5)

## 2016-07-01 LAB — COMPREHENSIVE METABOLIC PANEL
ALT: 14 U/L (ref 0–35)
AST: 12 U/L (ref 0–37)
Albumin: 4.5 g/dL (ref 3.5–5.2)
Alkaline Phosphatase: 94 U/L (ref 39–117)
BILIRUBIN TOTAL: 0.5 mg/dL (ref 0.2–1.2)
BUN: 12 mg/dL (ref 6–23)
CALCIUM: 9.9 mg/dL (ref 8.4–10.5)
CHLORIDE: 104 meq/L (ref 96–112)
CO2: 28 meq/L (ref 19–32)
CREATININE: 0.58 mg/dL (ref 0.40–1.20)
GFR: 127.92 mL/min (ref >60.00)
Glucose, Bld: 94 mg/dL (ref 70–99)
Potassium: 4.2 mEq/L (ref 3.5–5.1)
SODIUM: 139 meq/L (ref 135–145)
Total Protein: 6.8 g/dL (ref 6.0–8.3)

## 2016-07-01 LAB — VITAMIN D 25 HYDROXY (VIT D DEFICIENCY, FRACTURES): VITD: 31.11 ng/mL (ref 30.00–100.00)

## 2016-07-01 LAB — TSH: TSH: 2.38 u[IU]/mL (ref 0.35–4.50)

## 2016-07-01 MED ORDER — OMEPRAZOLE 20 MG PO CPDR: 20.0000 mg | DELAYED_RELEASE_CAPSULE | Freq: Every day | ORAL | 3 refills | Status: DC

## 2016-07-01 NOTE — Patient Instructions (Signed)
It was great meeting you today!  We will contact you with your lab results.  You will be contacted about your referral to GI.  Health Maintenance, Female Adopting a healthy lifestyle and getting preventive care can go a long way to promote health and wellness. Talk with your health care provider about what schedule of regular examinations is right for you. This is a good chance for you to check in with your provider about disease prevention and staying healthy. In between checkups, there are plenty of things you can do on your own. Experts have done a lot of research about which lifestyle changes and preventive measures are most likely to keep you healthy. Ask your health care provider for more information. Weight and diet Eat a healthy diet  Be sure to include plenty of vegetables, fruits, low-fat dairy products, and lean protein.  Do not eat a lot of foods high in solid fats, added sugars, or salt.  Get regular exercise. This is one of the most important things you can do for your health. ? Most adults should exercise for at least 150 minutes each week. The exercise should increase your heart rate and make you sweat (moderate-intensity exercise). ? Most adults should also do strengthening exercises at least twice a week. This is in addition to the moderate-intensity exercise.  Maintain a healthy weight  Body mass index (BMI) is a measurement that can be used to identify possible weight problems. It estimates body fat based on height and weight. Your health care provider can help determine your BMI and help you achieve or maintain a healthy weight.  For females 59 years of age and older: ? A BMI below 18.5 is considered underweight. ? A BMI of 18.5 to 24.9 is normal. ? A BMI of 25 to 29.9 is considered overweight. ? A BMI of 30 and above is considered obese.  Watch levels of cholesterol and blood lipids  You should start having your blood tested for lipids and cholesterol at 32 years  of age, then have this test every 5 years.  You may need to have your cholesterol levels checked more often if: ? Your lipid or cholesterol levels are high. ? You are older than 32 years of age. ? You are at high risk for heart disease.  Cancer screening Lung Cancer  Lung cancer screening is recommended for adults 18-20 years old who are at high risk for lung cancer because of a history of smoking.  A yearly low-dose CT scan of the lungs is recommended for people who: ? Currently smoke. ? Have quit within the past 15 years. ? Have at least a 30-pack-year history of smoking. A pack year is smoking an average of one pack of cigarettes a day for 1 year.  Yearly screening should continue until it has been 15 years since you quit.  Yearly screening should stop if you develop a health problem that would prevent you from having lung cancer treatment.  Breast Cancer  Practice breast self-awareness. This means understanding how your breasts normally appear and feel.  It also means doing regular breast self-exams. Let your health care provider know about any changes, no matter how small.  If you are in your 20s or 30s, you should have a clinical breast exam (CBE) by a health care provider every 1-3 years as part of a regular health exam.  If you are 70 or older, have a CBE every year. Also consider having a breast X-ray (mammogram) every year.  If you have a family history of breast cancer, talk to your health care provider about genetic screening.  If you are at high risk for breast cancer, talk to your health care provider about having an MRI and a mammogram every year.  Breast cancer gene (BRCA) assessment is recommended for women who have family members with BRCA-related cancers. BRCA-related cancers include: ? Breast. ? Ovarian. ? Tubal. ? Peritoneal cancers.  Results of the assessment will determine the need for genetic counseling and BRCA1 and BRCA2 testing.  Cervical  Cancer Your health care provider may recommend that you be screened regularly for cancer of the pelvic organs (ovaries, uterus, and vagina). This screening involves a pelvic examination, including checking for microscopic changes to the surface of your cervix (Pap test). You may be encouraged to have this screening done every 3 years, beginning at age 28.  For women ages 42-65, health care providers may recommend pelvic exams and Pap testing every 3 years, or they may recommend the Pap and pelvic exam, combined with testing for human papilloma virus (HPV), every 5 years. Some types of HPV increase your risk of cervical cancer. Testing for HPV may also be done on women of any age with unclear Pap test results.  Other health care providers may not recommend any screening for nonpregnant women who are considered low risk for pelvic cancer and who do not have symptoms. Ask your health care provider if a screening pelvic exam is right for you.  If you have had past treatment for cervical cancer or a condition that could lead to cancer, you need Pap tests and screening for cancer for at least 20 years after your treatment. If Pap tests have been discontinued, your risk factors (such as having a new sexual partner) need to be reassessed to determine if screening should resume. Some women have medical problems that increase the chance of getting cervical cancer. In these cases, your health care provider may recommend more frequent screening and Pap tests.  Colorectal Cancer  This type of cancer can be detected and often prevented.  Routine colorectal cancer screening usually begins at 32 years of age and continues through 32 years of age.  Your health care provider may recommend screening at an earlier age if you have risk factors for colon cancer.  Your health care provider may also recommend using home test kits to check for hidden blood in the stool.  A small camera at the end of a tube can be used to  examine your colon directly (sigmoidoscopy or colonoscopy). This is done to check for the earliest forms of colorectal cancer.  Routine screening usually begins at age 21.  Direct examination of the colon should be repeated every 5-10 years through 32 years of age. However, you may need to be screened more often if early forms of precancerous polyps or small growths are found.  Skin Cancer  Check your skin from head to toe regularly.  Tell your health care provider about any new moles or changes in moles, especially if there is a change in a mole's shape or color.  Also tell your health care provider if you have a mole that is larger than the size of a pencil eraser.  Always use sunscreen. Apply sunscreen liberally and repeatedly throughout the day.  Protect yourself by wearing long sleeves, pants, a wide-brimmed hat, and sunglasses whenever you are outside.  Heart disease, diabetes, and high blood pressure  High blood pressure causes heart disease and  increases the risk of stroke. High blood pressure is more likely to develop in: ? People who have blood pressure in the high end of the normal range (130-139/85-89 mm Hg). ? People who are overweight or obese. ? People who are African American.  If you are 73-54 years of age, have your blood pressure checked every 3-5 years. If you are 23 years of age or older, have your blood pressure checked every year. You should have your blood pressure measured twice-once when you are at a hospital or clinic, and once when you are not at a hospital or clinic. Record the average of the two measurements. To check your blood pressure when you are not at a hospital or clinic, you can use: ? An automated blood pressure machine at a pharmacy. ? A home blood pressure monitor.  If you are between 67 years and 20 years old, ask your health care provider if you should take aspirin to prevent strokes.  Have regular diabetes screenings. This involves taking a  blood sample to check your fasting blood sugar level. ? If you are at a normal weight and have a low risk for diabetes, have this test once every three years after 32 years of age. ? If you are overweight and have a high risk for diabetes, consider being tested at a younger age or more often. Preventing infection Hepatitis B  If you have a higher risk for hepatitis B, you should be screened for this virus. You are considered at high risk for hepatitis B if: ? You were born in a country where hepatitis B is common. Ask your health care provider which countries are considered high risk. ? Your parents were born in a high-risk country, and you have not been immunized against hepatitis B (hepatitis B vaccine). ? You have HIV or AIDS. ? You use needles to inject street drugs. ? You live with someone who has hepatitis B. ? You have had sex with someone who has hepatitis B. ? You get hemodialysis treatment. ? You take certain medicines for conditions, including cancer, organ transplantation, and autoimmune conditions.  Hepatitis C  Blood testing is recommended for: ? Everyone born from [birth year] through 1966. ? Anyone with known risk factors for hepatitis C.  Sexually transmitted infections (STIs)  You should be screened for sexually transmitted infections (STIs) including gonorrhea and chlamydia if: ? You are sexually active and are younger than 32 years of age. ? You are older than 32 years of age and your health care provider tells you that you are at risk for this type of infection. ? Your sexual activity has changed since you were last screened and you are at an increased risk for chlamydia or gonorrhea. Ask your health care provider if you are at risk.  If you do not have HIV, but are at risk, it may be recommended that you take a prescription medicine daily to prevent HIV infection. This is called pre-exposure prophylaxis (PrEP). You are considered at risk if: ? You are sexually active and  do not regularly use condoms or know the HIV status of your partner(s). ? You take drugs by injection. ? You are sexually active with a partner who has HIV.  Talk with your health care provider about whether you are at high risk of being infected with HIV. If you choose to begin PrEP, you should first be tested for HIV. You should then be tested every 3 months for as long as you are taking  PrEP. Pregnancy  If you are premenopausal and you may become pregnant, ask your health care provider about preconception counseling.  If you may become pregnant, take 400 to 800 micrograms (mcg) of folic acid every day.  If you want to prevent pregnancy, talk to your health care provider about birth control (contraception). Osteoporosis and menopause  Osteoporosis is a disease in which the bones lose minerals and strength with aging. This can result in serious bone fractures. Your risk for osteoporosis can be identified using a bone density scan.  If you are 45 years of age or older, or if you are at risk for osteoporosis and fractures, ask your health care provider if you should be screened.  Ask your health care provider whether you should take a calcium or vitamin D supplement to lower your risk for osteoporosis.  Menopause may have certain physical symptoms and risks.  Hormone replacement therapy may reduce some of these symptoms and risks. Talk to your health care provider about whether hormone replacement therapy is right for you. Follow these instructions at home:  Schedule regular health, dental, and eye exams.  Stay current with your immunizations.  Do not use any tobacco products including cigarettes, chewing tobacco, or electronic cigarettes.  If you are pregnant, do not drink alcohol.  If you are breastfeeding, limit how much and how often you drink alcohol.  Limit alcohol intake to no more than 1 drink per day for nonpregnant women. One drink equals 12 ounces of beer, 5 ounces of  wine, or 1 ounces of hard liquor.  Do not use street drugs.  Do not share needles.  Ask your health care provider for help if you need support or information about quitting drugs.  Tell your health care provider if you often feel depressed.  Tell your health care provider if you have ever been abused or do not feel safe at home. This information is not intended to replace advice given to you by your health care provider. Make sure you discuss any questions you have with your health care provider. Document Released: 07/20/2010 Document Revised: 06/12/2015 Document Reviewed: 10/08/2014 Elsevier Interactive Patient Education  [employer].

## 2016-07-01 NOTE — Progress Notes (Signed)
Taylor Carey is a 31 y.o. female here to Establish Care and Annual physical exam.  I acted as a Neurosurgeon for [employer], PA-C Corky Mull, LPN  History of Present Illness:   Chief Complaint  Patient presents with  . Establish Care    BC/BS  . Annual Exam    Acute Concerns: Trouble swallowing -- started two weeks ago, feels like "water is hitting something in throat", no fever, no history of smoking or excessive ETOH use, no unintentional weight loss, notices more often with liquids, has had some constipation with diet changes (now is doing keto diet) and hx of GERD, symptoms gradually worsening  Chronic Issues: GERD -- has been taking omeprazole for about 6 years, well-control  Health Maintenance: Immunizations -- up to date on vaccines Mammogram -- no early mammograms or ultrasounds PAP -- most recent 05/2016 - normal Diet --  Breakfast: bulletproof coffee Lunch: protein (eggs, bacon, cheese), broccoli Dinner: protein + veggies Drinks: water Caffeine intake -- 2-3 cups of coffee, occasional diet pepsi Sleep habits --  Can take up to 20 mg melatonin Exercise -- 5 days a week, 3-4 mornings a week, does strength training occaisionally Weight -- Weight: 165 lb (74.8 kg)  -- top weight was 172 lb about 3 weeks ago Mood -- no issues with anxiety or anger  Depression screen PHQ 2/9 07/01/2016  Decreased Interest 0  Down, Depressed, Hopeless 0  PHQ - 2 Score 0   Other providers/specialists: Sara Chu -- ob-gyn   PMHx, SurgHx, SocialHx, Medications, and Allergies were reviewed in the Visit Navigator and updated as appropriate.  Current Medications:   Current Outpatient Prescriptions:  .  Aspirin-Acetaminophen-Caffeine (EXCEDRIN MIGRAINE PO), Take 2 tablets by mouth as needed., Disp: , Rfl:  .  ibuprofen (ADVIL,MOTRIN) 200 MG tablet, Take 400 mg by mouth as needed., Disp: , Rfl:  .  omeprazole (PRILOSEC OTC) 20 MG tablet, Take 20 mg by mouth daily., Disp: ,  Rfl:  .  Prenatal Vit-Fe Fumarate-FA (PRENATAL MULTIVITAMIN) TABS tablet, Take 1 tablet by mouth daily at 12 noon., Disp: , Rfl:  .  omeprazole (PRILOSEC) 20 MG capsule, Take 1 capsule (20 mg total) by mouth daily., Disp: 30 capsule, Rfl: 3   Review of Systems:   Review of Systems  Constitutional: Negative for chills, fever, malaise/fatigue and weight loss.  HENT: Negative for hearing loss, sinus pain and sore throat.        Trouble swallowing feels knot in throat, x 2 weeks.  Eyes: Negative for blurred vision.  Respiratory: Negative for cough and shortness of breath.   Cardiovascular: Negative for chest pain, palpitations and leg swelling.  Gastrointestinal: Positive for constipation. Negative for abdominal pain, diarrhea, heartburn, nausea and vomiting.  Genitourinary: Negative for dysuria, frequency and urgency.  Musculoskeletal: Negative for back pain, myalgias and neck pain.  Skin: Negative for itching and rash.  Neurological: Negative for dizziness, tingling, seizures, loss of consciousness and headaches.  Endo/Heme/Allergies: Negative for polydipsia.  Psychiatric/Behavioral: Negative for depression. The patient is not nervous/anxious.     Vitals:   Vitals:   07/01/16 0840  BP: 100/62  Pulse: 73  Temp: 98.1 F (36.7 C)  TempSrc: Oral  SpO2: 96%  Weight: 165 lb (74.8 kg)  Height: 5' (1.524 m)     Body mass index is 32.22 kg/m.  Physical Exam:   Physical Exam  Constitutional: She appears well-developed and well-nourished. She is cooperative.  Non-toxic appearance. She does not have a sickly appearance. She does  not appear ill. No distress.  HENT:  Head: Normocephalic and atraumatic.  Right Ear: Tympanic membrane, external ear and ear canal normal. Tympanic membrane is not erythematous, not retracted and not bulging.  Left Ear: Tympanic membrane, external ear and ear canal normal. Tympanic membrane is not erythematous, not retracted and not bulging.  Eyes:  Conjunctivae, EOM and lids are normal. Pupils are equal, round, and reactive to light.  Neck: Trachea normal and full passive range of motion without pain.  Cardiovascular: Normal rate, regular rhythm, S1 normal, S2 normal, normal heart sounds and intact distal pulses.   Pulmonary/Chest: Effort normal and breath sounds normal. No tachypnea. No respiratory distress. She has no decreased breath sounds. She has no wheezes. She has no rhonchi. She has no rales.  Abdominal: Soft. Normal appearance and bowel sounds are normal. There is no tenderness.  Genitourinary:  Genitourinary Comments: Did not perform today  Musculoskeletal: Normal range of motion.  Lymphadenopathy:    She has no cervical adenopathy.  Neurological: She is alert. She has normal reflexes. No cranial nerve deficit or sensory deficit. GCS eye subscore is 4. GCS verbal subscore is 5. GCS motor subscore is 6.  Skin: Skin is warm, dry and intact.  Psychiatric: She has a normal mood and affect. Her speech is normal and behavior is normal.  Nursing note and vitals reviewed.     Assessment and Plan:    [place] was seen today for establish care and annual exam.  Diagnoses and all orders for this visit:  Encounter for general adult medical examination with abnormal findings Today patient counseled on age appropriate routine health concerns for screening and prevention, each reviewed and up to date or declined. Immunizations reviewed and up to date or declined. Labs ordered and reviewed. Risk factors for depression reviewed and negative. Hearing function and visual acuity are intact. ADLs screened and addressed as needed. Functional ability and level of safety reviewed and appropriate. Education, counseling and referrals performed based on assessed risks today. Patient provided with a copy of personalized plan for preventive services. -     CBC with Differential/Platelet -     Comprehensive metabolic panel  Obesity, unspecified  classification, unspecified obesity type, unspecified whether serious comorbidity present Discussed dietary changes. Recommended patient make an appointment with me to discuss healthy eating and weight loss if she is interested. Commended patient on recent weight loss. -     TSH  Encounter for screening for human immunodeficiency virus (HIV) -     HIV antibody  Encounter for lipid screening for cardiovascular disease -     Lipid panel  Dysphagia, unspecified type Recommend referral to GI for further evaluation and treatment. -     Ambulatory referral to Gastroenterology  GERD Well controlled on omeprazole, refill omeprazole. Discuss with GI when visits.  Vitamin D deficiency -     VITAMIN D 25 Hydroxy (Vit-D Deficiency, Fractures)  . Reviewed expectations re: course of current medical issues. . Discussed self-management of symptoms. . Outlined signs and symptoms indicating need for more acute intervention. . Patient verbalized understanding and all questions were answered. . See orders for this visit as documented in the electronic medical record. . Patient received an After-Visit Summary.  CMA or LPN served as scribe during this visit. History, Physical, and Plan performed by medical provider. Documentation and orders reviewed and attested to.  Jarold MottoSamantha Xochil Shanker, PA-C

## 2016-07-02 LAB — HIV ANTIBODY (ROUTINE TESTING W REFLEX): HIV 1&2 Ab, 4th Generation: NONREACTIVE

## 2016-07-05 ENCOUNTER — Telehealth: Payer: Self-pay | Admitting: *Deleted

## 2016-07-05 NOTE — Telephone Encounter (Signed)
Left detailed message on personal voicemail that paper work Biometric Screening is completed and is ready for pickup. Will put at the front desk if any questions please call office. Copy of paper work made to scan to chart and original put at front office for pickup.

## 2016-07-12 ENCOUNTER — Encounter: Payer: Self-pay | Admitting: Gastroenterology

## 2016-07-19 ENCOUNTER — Telehealth: Payer: Self-pay | Admitting: Physician Assistant

## 2016-07-19 NOTE — Telephone Encounter (Signed)
Spoke to [place]. So did not sound in acute distress and confirmed she was not experiencing any throat swelling at the time. Patient confirmed she is 'fine' and would rather see Samantha tomorrow at her scheduled appointment of 8:00am. Patient is aware to go to ED as soon as she feels her throat swelling again.   Patient would like to have her T3 and T4 levels drawn as she saw on MyChart only a TSH was drawn last month.   Patient has appointment with GI on August 8th.

## 2016-07-19 NOTE — Telephone Encounter (Signed)
Noted.  Jarold MottoSamantha Genoveva Singleton PA-C 07/19/16

## 2016-07-19 NOTE — Telephone Encounter (Signed)
Left message on voicemail to call office.  

## 2016-07-19 NOTE — Telephone Encounter (Signed)
Patient called to advise that she is experiencing regular swelling in her throat, especially in the evening. She states that this is happening regularly, and that last night it became difficult to breathe. She is currently not experiencing the swelling.   She is concerned that it has something to do with her thyroid. She scheduled for tomorrow morning, but states she would be open to any medical advice.

## 2016-07-20 ENCOUNTER — Encounter: Payer: Self-pay | Admitting: Physician Assistant

## 2016-07-20 ENCOUNTER — Ambulatory Visit (INDEPENDENT_AMBULATORY_CARE_PROVIDER_SITE_OTHER): Payer: BLUE CROSS/BLUE SHIELD | Admitting: Physician Assistant

## 2016-07-20 VITALS — BP 126/80 | HR 68 | Temp 98.3°F | Ht 60.0 in | Wt 165.4 lb

## 2016-07-20 DIAGNOSIS — R946 Abnormal results of thyroid function studies: Secondary | ICD-10-CM | POA: Diagnosis not present

## 2016-07-20 DIAGNOSIS — R221 Localized swelling, mass and lump, neck: Secondary | ICD-10-CM | POA: Diagnosis not present

## 2016-07-20 LAB — T3, FREE: T3 FREE: 3.1 pg/mL (ref 2.3–4.2)

## 2016-07-20 LAB — T4, FREE: FREE T4: 0.78 ng/dL (ref 0.60–1.60)

## 2016-07-20 LAB — TSH: TSH: 3 u[IU]/mL (ref 0.35–4.50)

## 2016-07-20 NOTE — Progress Notes (Signed)
Taylor Carey is a 31 y.o. female here for throat swelling.  I acted as a Neurosurgeonscribe for Energy East CorporationSamantha Avenir Lozinski, Taylor Carey Taylor Mullonna Orphanos, Taylor Carey  History of Present Illness:   Chief Complaint  Patient presents with  . Thorat swelling    x 3 weeks, worse at night  . Hoarse    Pt presents here in the office today c/o throat swelling x 3 weeks, worse at night and hoarse voice. She saw me for this on 07/01/16 and labs were normal, but a referral was put in to GI to evaluate these issues. She states that her symptoms are worsening with time: SOB off and on, with fatigue. She denies "difficulty swallowing" but does have painful swallowing with some tough foods. Has had some coughing attacks. Has had some fatigue and unintentional weight gain.   She is tearful today, states that she has had one miscarriage and is trying to get pregnant and is worried that if her thyroid levels are off it will cause continued difficulties getting pregnant. She does admit that the "SOB" that she is experiencing could be more panic/anxiety related than actual SOB.    [facility] -- she has never been on thyroid medications or evaluated by endo: T4 = 10.4 T3 uptake ratio = 22 Free thyroxine index = 2.3    Past Medical History:  Diagnosis Date  . Abnormal Pap smear of cervix 05/29/13   ASCUS +HPV HR  . GERD (gastroesophageal reflux disease)   . Insomnia   . Kidney stone      Social History   Social History  . Marital status: Married    Spouse name: N/A  . Number of children: N/A  . Years of education: N/A   Occupational History  . Not on file.   Social History Main Topics  . Smoking status: Never Smoker  . Smokeless tobacco: Never Used  . Alcohol use 0.6 oz/week    1 Standard drinks or equivalent per week     Comment: social  . Drug use: No  . Sexual activity: Yes    Partners: Male   Other Topics Concern  . Not on file   Social History Narrative   Einar [employer]heasant[employer]er   Married for  < 1 year   No children   Fun: zumba, work out    Past Surgical History:  Procedure Laterality Date  . kidney stones    . TONSILLECTOMY      Family History  Problem Relation Age of Onset  . Crohn's disease Mother   . Hypertension Mother   . Kidney disease Mother   . Cancer Mother        female cancer  . Glaucoma Mother   . Cancer Father        kidney  . Alzheimer's disease Maternal Grandfather   . Parkinson's disease Paternal Grandfather   . Colon cancer Neg Hx     Allergies  Allergen Reactions  . Bactrim [Sulfamethoxazole-Trimethoprim] Nausea Only  . Gardasil [Hpv Vaccine Recombinant (Yeast Derived)]     questionable  . Macrobid [Nitrofurantoin]     Chest tightness    Current Medications:   Current Outpatient Prescriptions:  .  Aspirin-Acetaminophen-Caffeine (EXCEDRIN MIGRAINE PO), Take 2 tablets by mouth as needed., Disp: , Rfl:  .  ibuprofen (ADVIL,MOTRIN) 200 MG tablet, Take 400 mg by mouth as needed., Disp: , Rfl:  .  omeprazole (PRILOSEC OTC) 20 MG tablet, Take 20 mg by mouth daily., Disp: , Rfl:  .  Prenatal Vit-Fe Fumarate-FA (PRENATAL MULTIVITAMIN) TABS tablet, Take 1 tablet by mouth daily at 12 noon., Disp: , Rfl:    Review of Systems:   Review of Systems  Constitutional: Positive for malaise/fatigue. Negative for chills, fever and weight loss.  Respiratory: Positive for cough and shortness of breath.   Cardiovascular: Negative for chest pain, palpitations, orthopnea and claudication.  Neurological: Negative for dizziness, tingling and headaches.  Psychiatric/Behavioral: The patient is nervous/anxious.     Vitals:   Vitals:   07/20/16 0805  BP: 126/80  Pulse: 68  Temp: 98.3 F (36.8 C)  TempSrc: Oral  SpO2: 97%  Weight: 165 lb 6.1 oz (75 kg)  Height: 5' (1.524 m)     Body mass index is 32.3 kg/m.  Physical Exam:   Physical Exam  Constitutional: She appears well-developed. She is cooperative.  Non-toxic appearance. She does not have a  sickly appearance. She does not appear ill. No distress.  HENT:  Mouth/Throat: Uvula is midline and oropharynx is clear and moist.  Neck: Trachea normal, normal range of motion and full passive range of motion without pain. Neck supple. No thyroid mass and no thyromegaly present.  Cardiovascular: Normal rate, regular rhythm, S1 normal, S2 normal, normal heart sounds and normal pulses.   No LE edema  Pulmonary/Chest: Effort normal and breath sounds normal.  Neurological: She is alert.  Psychiatric: Her speech is normal and behavior is normal. Thought content normal. Her mood appears anxious.  Tearful yet pleasant  Nursing note and vitals reviewed.     Assessment and Plan:    [name] was seen today for thorat swelling and hoarse.  Diagnoses and all orders for this visit:  Borderline abnormal thyroid function test  History of slightly low T3 uptake ratio, we will re-check TSH, T4 and T3 today. Reviewed current thyroid screening guidelines with patient and discussed that we will pursue additional testing/consults/imaging based on symptoms and lab results. -     T3, free -     T4, free -     TSH  Throat swelling Exam benign. Will check thyroid labs. Keep GI appointment in August. I advised patient that if she develops worsening shortness of breath or difficulty swallowing, please let us know. We discussed trialing a brief course of 20 mg prednisone x 5 days but she is reluctant given the possible side effect of weight gain with this medication. Consider thyroid ultrasound if symptoms persist. -     T3, free -     T4, free -     TSH  . Reviewed expectations re: course of current medical issues. . Discussed self-management of symptoms. . Outlined signs and symptoms indicating need for more acute intervention. . Patient verbalized understanding and all questions were answered. . See orders for this visit as documented in the electronic medical record. . Patient received an After-Visit  Summary.  CMA or Taylor Carey served as scribe during this visit. History, Physical, and Plan performed by medical provider. Documentation and orders reviewed and attested to.  Jarold Motto, Taylor Carey

## 2016-07-20 NOTE — Patient Instructions (Addendum)
It was great to see you!  Please let us know if you would like to consider a brief course of prednisone, we could do 20 mg x 5 days, to see if that helps with swelling/inflammation.  If you develop worsening shortness of breath or difficulty swallowing, please let us know.  We will call you with your lab results and discuss imaging (thyroid ultrasound) at that time.

## 2016-07-22 ENCOUNTER — Encounter: Payer: Self-pay | Admitting: Physician Assistant

## 2016-07-22 ENCOUNTER — Other Ambulatory Visit: Payer: Self-pay | Admitting: Physician Assistant

## 2016-07-22 NOTE — Addendum Note (Signed)
Addended by: Haynes BastWORLEY, Aaran Enberg J on: 07/22/2016 04:42 PM   Modules accepted: Orders

## 2016-08-05 ENCOUNTER — Ambulatory Visit
Admission: RE | Admit: 2016-08-05 | Discharge: 2016-08-05 | Disposition: A | Discharge status: Home / Self Care | Payer: BLUE CROSS/BLUE SHIELD | Source: Ambulatory Visit | Attending: Physician Assistant | Admitting: Physician Assistant

## 2016-08-05 DIAGNOSIS — R221 Localized swelling, mass and lump, neck: Secondary | ICD-10-CM

## 2016-08-05 DIAGNOSIS — R946 Abnormal results of thyroid function studies: Secondary | ICD-10-CM

## 2016-08-25 ENCOUNTER — Ambulatory Visit: Payer: BLUE CROSS/BLUE SHIELD | Admitting: Gastroenterology

## 2016-08-25 ENCOUNTER — Telehealth: Payer: Self-pay | Admitting: Certified Nurse Midwife

## 2016-08-25 NOTE — Telephone Encounter (Signed)
Spoke with patient. Advised of message as seen below from PepsiCoDeborah Leonard CNM. Patient is agreeable and verbalizes understanding. Will keep appointment for 09/10/16. Will contact the office with any concerns or problems before appointment.  Routing to provider for final review. Patient agreeable to disposition. Will close encounter.

## 2016-08-25 NOTE — Telephone Encounter (Signed)
Left message to call Jaan Fischel at [phone].  

## 2016-08-25 NOTE — Telephone Encounter (Signed)
Left message to call Taylor Carey at [phone].  

## 2016-08-25 NOTE — Telephone Encounter (Signed)
Spoke with patient. Reports LMP 07/19/16. Positive UPT on 8/3 and 8/4. Request OV with Taylor Carey, Taylor Carey Next week. Advised patient Taylor Carey, Taylor Carey is out of the office next week, can schedule with covering provider, patient declined. Patient scheduled for OV on 09/10/16 at 4pm with Taylor Carey, Taylor Carey, patient declined earlier appointments offered d/t work schedule. Patient aware to return call to office if symptoms change or to move to earlier appointment. Patient asking if this appointment is going to be too late? Advised patient will review with Taylor Carey, Taylor Carey and return call with any additional recommendations.  Taylor Carey, Taylor Carey - please advise on scheduled OV for pregnancy confirmation?

## 2016-08-25 NOTE — Telephone Encounter (Signed)
Left message to call Omarr Hann at [phone]. 

## 2016-08-25 NOTE — Telephone Encounter (Signed)
Return call to Jill H. °

## 2016-08-25 NOTE — Telephone Encounter (Signed)
Patient returning your call.

## 2016-08-25 NOTE — Telephone Encounter (Signed)
Patient will be approx. 6 + weeks this is fine. If she has issues will need to be sooner

## 2016-08-25 NOTE — Telephone Encounter (Signed)
Patient called to report she's had a positive urine pregnancy test. She said she does not have any additional concerns at this time. She's like to come in for an appointment to see Leota Sauerseborah Leonard, CNM next Thursday, 09/02/16.

## 2016-09-08 ENCOUNTER — Telehealth: Payer: Self-pay | Admitting: Certified Nurse Midwife

## 2016-09-08 ENCOUNTER — Ambulatory Visit (INDEPENDENT_AMBULATORY_CARE_PROVIDER_SITE_OTHER): Payer: BLUE CROSS/BLUE SHIELD | Admitting: Certified Nurse Midwife

## 2016-09-08 ENCOUNTER — Encounter: Payer: Self-pay | Admitting: Certified Nurse Midwife

## 2016-09-08 VITALS — BP 118/80 | HR 70 | Resp 16 | Ht 60.0 in | Wt 168.0 lb

## 2016-09-08 DIAGNOSIS — N938 Other specified abnormal uterine and vaginal bleeding: Secondary | ICD-10-CM | POA: Diagnosis not present

## 2016-09-08 DIAGNOSIS — Z3202 Encounter for pregnancy test, result negative: Secondary | ICD-10-CM | POA: Diagnosis not present

## 2016-09-08 DIAGNOSIS — N912 Amenorrhea, unspecified: Secondary | ICD-10-CM

## 2016-09-08 LAB — CBC
HEMATOCRIT: 39.7 % (ref 34.0–46.6)
HEMOGLOBIN: 13.8 g/dL (ref 11.1–15.9)
MCH: 28.3 pg (ref 26.6–33.0)
MCHC: 34.8 g/dL (ref 31.5–35.7)
MCV: 82 fL (ref 79–97)
Platelets: 276 10*3/uL (ref 150–379)
RBC: 4.87 x10E6/uL (ref 3.77–5.28)
RDW: 13.6 % (ref 12.3–15.4)
WBC: 7.1 10*3/uL (ref 3.4–10.8)

## 2016-09-08 LAB — BETA HCG QUANT (REF LAB): HCG QUANT: 16 m[IU]/mL

## 2016-09-08 LAB — POCT URINE PREGNANCY: Preg Test, Ur: NEGATIVE

## 2016-09-08 NOTE — Progress Notes (Addendum)
Subjective:     Patient ID: Taylor Carey, female   DOB: [birth year], 31 y.o.   MRN: [id number]  HPI 31 yo married white female g0000 here with history of positive 2 UPT's  at home with bleeding x4-5 days period like and light spotting only today. She did another home UPT the second day of bleeding which was still positive. Patient kept pregnancy test that were positive( did not bring them). Some cramping at onset of bleeding none since. Patient had similar occurrence in 1/18 with no sequelae. Patient and spouse were not using contraception. Would be happy with pregnancy.Had started prenatal vitamins and continues with. Denied any health issues. Spouse has fathered two children who live with them. Patient denies any history of genetic issues with self or family. Sister has two children also. Emotionally distraught with occurring again.  No other concerns today.   Review of Systems  Constitutional: Negative for activity change and appetite change.  Respiratory: Negative.   Cardiovascular: Negative.   Gastrointestinal: Negative.   Genitourinary: Negative for pelvic pain.  Skin: Negative.   Neurological: Negative for weakness and light-headedness.  Psychiatric/Behavioral: Negative.        Objective:   Physical Exam  Constitutional: She is oriented to person, place, and time. She appears well-developed and well-nourished.  Abdominal: Soft. She exhibits no distension and no mass. There is no tenderness. There is no rebound and no guarding.  Genitourinary: Uterus normal. There is rash on the right labia. There is no tenderness, lesion or injury on the right labia. There is no rash, tenderness, lesion or injury on the left labia. Uterus is not enlarged and not tender. Cervix exhibits discharge. Cervix exhibits no motion tenderness and no friability. Right adnexum displays no mass, no tenderness and no fullness. Left adnexum displays no mass, no tenderness and no fullness. No tenderness or bleeding in  the vagina. Vaginal discharge found.  Genitourinary Comments: Small amount of brown discharge noted in vaginal vault Brown/black small amount noted from cervical os  Lymphadenopathy:       Right: No inguinal adenopathy present.       Left: No inguinal adenopathy present.  Neurological: She is alert and oriented to person, place, and time.  Skin: Skin is warm and dry.  Psychiatric: She has a normal mood and affect. Her behavior is normal. Judgment and thought content normal.       Assessment:     History of Amenorrhea with positive  UPT x 3 at home with spontaneous bleeding and negative UPT today, ? SAB Normal pelvic exam Previous of similar occurrence in 1/18 with positive UPT x 2 at home and then period like bleeding No family or personal history of genetic or birth defects (spouse Hispanic) Unknown ABO RH    Plan:     Discussed normal pelvic exam findings and negative UPT here today. Discussed need to evaluate with labs of HCG, CBC both stat, and ABO RH. Patient agreeable.  Given warning signs of bleeding after SAB? Questions addressed. Discussed possible genetic issue or hormonal change with this occurring or unknown occurrence. Also discussed emotional need to be pregnant and UPT negative. Patient denies this is the case.  Will notify of labs result and recommendation. Recommended consistent contraception and return appointment in 2 weeks for consultation and labs if indicated regarding SAB x 2. RV as above prn  Addendum : HCG 16, CBC WNL, ABO RH: A positive Spouse blood type A positive    15 minutes in addition to  exam spent in discussion regarding SAB occurrence.

## 2016-09-08 NOTE — Telephone Encounter (Signed)
Spoke with patient regarding HCG results being 16 and will need to recheck in 2 days. Friday am 09/10/16 at 10:30 .  She voiced understanding of while level needs to be rechecked and will keep appt. Questions addressed.

## 2016-09-09 ENCOUNTER — Telehealth: Payer: Self-pay

## 2016-09-09 ENCOUNTER — Other Ambulatory Visit: Payer: Self-pay | Admitting: Certified Nurse Midwife

## 2016-09-09 DIAGNOSIS — O039 Complete or unspecified spontaneous abortion without complication: Secondary | ICD-10-CM

## 2016-09-09 LAB — "ABO AND RH ": Rh Factor: POSITIVE

## 2016-09-09 NOTE — Telephone Encounter (Signed)
Called patient with her blood type result. Pt wanted to let us know her husbands blood type is A+.

## 2016-09-09 NOTE — Telephone Encounter (Signed)
-----   Message from Verner Chol, CNM sent at 09/09/2016  8:26 AM EDT ----- Notify patient that blood type is B positive Also the 10:30 am lab appt. Is confirmed for 09/10/16

## 2016-09-10 ENCOUNTER — Ambulatory Visit: Payer: Self-pay | Admitting: Certified Nurse Midwife

## 2016-09-10 ENCOUNTER — Other Ambulatory Visit (INDEPENDENT_AMBULATORY_CARE_PROVIDER_SITE_OTHER): Payer: BLUE CROSS/BLUE SHIELD

## 2016-09-10 ENCOUNTER — Other Ambulatory Visit: Payer: Self-pay

## 2016-09-10 ENCOUNTER — Telehealth: Payer: Self-pay

## 2016-09-10 DIAGNOSIS — O039 Complete or unspecified spontaneous abortion without complication: Secondary | ICD-10-CM

## 2016-09-10 LAB — BETA HCG QUANT (REF LAB): HCG QUANT: 7 m[IU]/mL

## 2016-09-10 NOTE — Telephone Encounter (Signed)
STAT lab call from [place] with [facility]. Beta HCG, Quant from today is 7.

## 2016-09-10 NOTE — Telephone Encounter (Signed)
Spoke with patient. Results given as seen below. Patient verbalizes understanding. Advised will need to use BUM such as condoms for contraception. Lab appointment scheduled for 09/17/2016 at 8:30 am. Lab ordered.  Routing to provider for final review. Patient agreeable to disposition. Will close encounter.

## 2016-09-10 NOTE — Telephone Encounter (Signed)
Will need repeat in one week . Please order. Contraception recommended.

## 2016-09-16 NOTE — Progress Notes (Signed)
Encounter reviewed by Dr. Janean SarkBrook Amundson C. Silva. 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

## 2016-09-17 ENCOUNTER — Other Ambulatory Visit (INDEPENDENT_AMBULATORY_CARE_PROVIDER_SITE_OTHER): Payer: BLUE CROSS/BLUE SHIELD

## 2016-09-17 ENCOUNTER — Telehealth: Payer: Self-pay

## 2016-09-17 DIAGNOSIS — O039 Complete or unspecified spontaneous abortion without complication: Secondary | ICD-10-CM | POA: Diagnosis not present

## 2016-09-17 LAB — BETA HCG QUANT (REF LAB)

## 2016-09-17 NOTE — Telephone Encounter (Signed)
Thank you for the update!

## 2016-09-17 NOTE — Telephone Encounter (Signed)
Will need call to patient with results<1 and needs appt. Now with Dr. Edward JollySIlva in 2 weeks for plan. Consistent contraception use. Note to [facility]

## 2016-09-17 NOTE — Telephone Encounter (Signed)
I will forward this to Debbie to review for an appropriate Rx for birth control.  Cc- Sara Chuebbie Leonard

## 2016-09-17 NOTE — Telephone Encounter (Signed)
[facility] HCG, quant is <1. Results to be faxed to the office.

## 2016-09-17 NOTE — Telephone Encounter (Signed)
Spoke with patient. Advised of message as seen below from PepsiCoDeborah Leonard CNM. Patient states that she does not desire to try for pregnancy again at this time. States she and her spouse have decided to wait to try until next year. Would like to restart Gianvi OCP.  Routing to Dr.Silva for review.

## 2016-09-21 MED ORDER — DROSPIRENONE-ETHINYL ESTRADIOL 3-0.02 MG PO TABS: 1.0000 | ORAL_TABLET | Freq: Every day | ORAL | 2 refills | Status: DC

## 2016-09-21 NOTE — Telephone Encounter (Signed)
Left message to call Kaitlyn at [phone]. 

## 2016-09-21 NOTE — Telephone Encounter (Signed)
Patient returning call.

## 2016-09-21 NOTE — Telephone Encounter (Signed)
Spoke with patient. Patient would like to return to taking Gianvi. Rx for Helen HashimotoGianvi #3 2RF sent to pharmacy on file. Advised she will need to start OCP with the first day of her next menses. Will need to take 1 tablet daily at the same time each day. Will need to consult with Dr.Silva prior to coming off OCP to try for pregnancy. Patient verbalizes understanding.  Routing to provider for final review. Patient agreeable to disposition. Will close encounter.

## 2016-09-21 NOTE — Telephone Encounter (Signed)
I am fine with her restarting OCP last one on file was Loestrin 24, she was previously on [place]. Need to clarify prior to placing order. If she decides to plan for pregnancy, needs appointment with Dr. Edward JollySilva to evaluate for future pregnancy and does not need to stop OCP. If she feels she needs OV to discuss please schedule.

## 2017-04-28 ENCOUNTER — Telehealth: Payer: Self-pay | Admitting: Physician Assistant

## 2017-04-28 NOTE — Telephone Encounter (Signed)
Copied from CRM [id number]. Topic: General - Other >> Apr 28, 2017 12:25 PM Cecelia ByarsGreen, Temeka L, RMA wrote: Reason for CRM: patient is requesting a transfer of care from Jarold MottoSamantha Worley to [facility]encion [facility]ille [facility] , please let pt know once she can schedule with Dr. Mardelle MatteAndy

## 2017-04-29 NOTE — Telephone Encounter (Signed)
Ok with me for patient to transfer to Dr. Mardelle MatteAndy.  Jarold MottoSamantha Vianney Kopecky PA-C

## 2017-06-03 NOTE — Telephone Encounter (Signed)
Pt calling checking status on transferring care from [facility] to Dr. Mardelle Matte, request never got to Dr. Mardelle Matte for approval, Please advise ok to transfer.

## 2017-06-03 NOTE — Telephone Encounter (Signed)
Sure thing. Please call to schedule.

## 2017-06-07 NOTE — Telephone Encounter (Signed)
Pt has been scheduled.  °

## 2017-06-15 ENCOUNTER — Encounter: Payer: Self-pay | Admitting: Certified Nurse Midwife

## 2017-06-15 ENCOUNTER — Ambulatory Visit (INDEPENDENT_AMBULATORY_CARE_PROVIDER_SITE_OTHER): Payer: BLUE CROSS/BLUE SHIELD | Admitting: Certified Nurse Midwife

## 2017-06-15 ENCOUNTER — Other Ambulatory Visit: Payer: Self-pay

## 2017-06-15 ENCOUNTER — Other Ambulatory Visit (HOSPITAL_COMMUNITY)
Admission: RE | Admit: 2017-06-15 | Discharge: 2017-06-15 | Disposition: A | Discharge status: Home / Self Care | Payer: BLUE CROSS/BLUE SHIELD | Source: Ambulatory Visit | Attending: Certified Nurse Midwife | Admitting: Certified Nurse Midwife

## 2017-06-15 VITALS — BP 118/80 | HR 70 | Resp 16 | Ht 60.0 in | Wt 160.0 lb

## 2017-06-15 DIAGNOSIS — Z124 Encounter for screening for malignant neoplasm of cervix: Secondary | ICD-10-CM | POA: Insufficient documentation

## 2017-06-15 DIAGNOSIS — Z87898 Personal history of other specified conditions: Secondary | ICD-10-CM | POA: Diagnosis not present

## 2017-06-15 DIAGNOSIS — Z1151 Encounter for screening for human papillomavirus (HPV): Secondary | ICD-10-CM | POA: Insufficient documentation

## 2017-06-15 DIAGNOSIS — Z8742 Personal history of other diseases of the female genital tract: Secondary | ICD-10-CM

## 2017-06-15 DIAGNOSIS — Z01419 Encounter for gynecological examination (general) (routine) without abnormal findings: Secondary | ICD-10-CM | POA: Diagnosis not present

## 2017-06-15 NOTE — Patient Instructions (Signed)

## 2017-06-15 NOTE — Progress Notes (Signed)
32 y.o. [id number] Married  Caucasian Fe here for annual exam. Periods normal except for last month had one day bleeding light at ovulation and then normal period. Working on exercise and better diet with 12 pound weight loss. See  Roland Rack in a few weeks to establish PCP care and will have labs. Continues with Prenatal vitamins and occasional alcohol use. Using Withdrawal for contraception now. Not sure she would try for another pregnancy. Emotionally doing well now, since last SAB. Has thought about genetic counseling and will advise if she decides to go forward. Has big trip planned in 8/19 and would like to enjoy with spouse. No other health concerns today.  Patient's last menstrual period was 06/10/2017 (exact date).          Sexually active: Yes.    The current method of family planning is none.    Exercising: Yes.    cardio, strength training 3 times weekly Smoker:  no  Health Maintenance: Pap:  06-06-15 neg, 06-11-16 neg History of Abnormal Pap: yes MMG:  none Self Breast exams: no Colonoscopy:  none BMD:   none TDaP:  2014 Shingles: no Pneumonia: no Hep C and HIV: HIV neg 2017 Labs: no   reports that she has never smoked. She has never used smokeless tobacco. She reports that she drinks about 0.6 - 1.2 oz of alcohol per week. She reports that she does not use drugs.  Past Medical History:  Diagnosis Date  . Abnormal Pap smear of cervix 05/29/13   ASCUS +HPV HR  . GERD (gastroesophageal reflux disease)   . Insomnia   . Kidney stone     Past Surgical History:  Procedure Laterality Date  . kidney stones    . TONSILLECTOMY      Current Outpatient Medications  Medication Sig Dispense Refill  . Aspirin-Acetaminophen-Caffeine (EXCEDRIN MIGRAINE PO) Take 2 tablets by mouth as needed.    Marland Kitchen ibuprofen (ADVIL,MOTRIN) 200 MG tablet Take 400 mg by mouth as needed.    Marland Kitchen omeprazole (PRILOSEC OTC) 20 MG tablet Take 20 mg by mouth daily.    . Prenatal Vit-Fe Fumarate-FA (PRENATAL  MULTIVITAMIN) TABS tablet Take 1 tablet by mouth daily at 12 noon.     No current facility-administered medications for this visit.     Family History  Problem Relation Age of Onset  . Crohn's disease Mother   . Hypertension Mother   . Kidney disease Mother   . Cancer Mother        female cancer  . Glaucoma Mother   . Cancer Father        kidney  . Alzheimer's disease Maternal Grandfather   . Parkinson's disease Paternal Grandfather   . Colon cancer Neg Hx     ROS:  Pertinent items are noted in HPI.  Otherwise, a comprehensive ROS was negative.  Exam:   BP 118/80   Pulse 70   Resp 16   Ht 5' (1.524 m)   Wt 160 lb (72.6 kg)   LMP 06/10/2017 (Exact Date)   BMI 31.25 kg/m  Height: 5' (152.4 cm) Ht Readings from Last 3 Encounters:  06/15/17 5' (1.524 m)  09/08/16 5' (1.524 m)  07/20/16 5' (1.524 m)    General appearance: alert, cooperative and appears stated age Head: Normocephalic, without obvious abnormality, atraumatic Neck: no adenopathy, supple, symmetrical, trachea midline and thyroid normal to inspection and palpation Lungs: clear to auscultation bilaterally Breasts: normal appearance, no masses or tenderness, No nipple retraction or dimpling, No  nipple discharge or bleeding, No axillary or supraclavicular adenopathy Heart: regular rate and rhythm Abdomen: soft, non-tender; no masses,  no organomegaly Extremities: extremities normal, atraumatic, no cyanosis or edema Skin: Skin color, texture, turgor normal. No rashes or lesions Lymph nodes: Cervical, supraclavicular, and axillary nodes normal. No abnormal inguinal nodes palpated Neurologic: Grossly normal   Pelvic: External genitalia:  no lesions              Urethra:  normal appearing urethra with no masses, tenderness or lesions              Bartholin's and Skene's: normal                 Vagina: normal appearing vagina with normal color and discharge, no lesions              Cervix: no bleeding following  Pap, no cervical motion tenderness and no lesions              Pap taken: Yes.   Bimanual Exam:  Uterus:  normal size, contour, position, consistency, mobility, non-tender and mid position              Adnexa: normal adnexa and no mass, fullness, tenderness               Rectovaginal: Confirms               Anus:  normal sphincter tone, no lesions  Chaperone present: yes  A:  Well Woman with normal exam  Contraception withdrawal  History of SAB x 2  Establishing with PCP     P:   Reviewed health and wellness pertinent to exam  Discussed condom use also if she does not want pregnancy at this time. Continue with Prenatal vitamins daily.   Discuss referral to MFM for consultation regarding 2 SAB and possible genetic issues. Patient not sure she is ready, but will advise if plans to move forward with this.  Encouraged to establish with PCP for other health issues as needed.  Pap smear: yes  counseled on breast self exam, family planning choices, adequate intake of calcium and vitamin D, diet and exercise  return annually or prn  An After Visit Summary was printed and given to the patient.

## 2017-06-17 LAB — CYTOLOGY - PAP
DIAGNOSIS: NEGATIVE
HPV: NOT DETECTED

## 2017-07-05 ENCOUNTER — Encounter: Payer: Self-pay | Admitting: Family Medicine

## 2017-07-05 ENCOUNTER — Other Ambulatory Visit: Payer: Self-pay

## 2017-07-05 ENCOUNTER — Ambulatory Visit (INDEPENDENT_AMBULATORY_CARE_PROVIDER_SITE_OTHER): Payer: BLUE CROSS/BLUE SHIELD | Admitting: Family Medicine

## 2017-07-05 VITALS — BP 112/80 | HR 83 | Temp 98.1°F | Ht 60.0 in | Wt 157.2 lb

## 2017-07-05 DIAGNOSIS — N200 Calculus of kidney: Secondary | ICD-10-CM | POA: Insufficient documentation

## 2017-07-05 DIAGNOSIS — Z0001 Encounter for general adult medical examination with abnormal findings: Secondary | ICD-10-CM

## 2017-07-05 DIAGNOSIS — S76311A Strain of muscle, fascia and tendon of the posterior muscle group at thigh level, right thigh, initial encounter: Secondary | ICD-10-CM

## 2017-07-05 LAB — CBC WITH DIFFERENTIAL/PLATELET
BASOS ABS: 0.1 10*3/uL (ref 0.0–0.1)
BASOS PCT: 0.7 % (ref 0.0–3.0)
EOS ABS: 0.2 10*3/uL (ref 0.0–0.7)
Eosinophils Relative: 2.7 % (ref 0.0–5.0)
HEMATOCRIT: 40.7 % (ref 36.0–46.0)
Hemoglobin: 13.4 g/dL (ref 12.0–15.0)
LYMPHS ABS: 2.6 10*3/uL (ref 0.7–4.0)
Lymphocytes Relative: 30.3 % (ref 12.0–46.0)
MCHC: 32.9 g/dL (ref 30.0–36.0)
MCV: 88.1 fl (ref 78.0–100.0)
Monocytes Absolute: 0.6 10*3/uL (ref 0.1–1.0)
Monocytes Relative: 7.5 % (ref 3.0–12.0)
NEUTROS ABS: 5 10*3/uL (ref 1.4–7.7)
NEUTROS PCT: 58.8 % (ref 43.0–77.0)
PLATELETS: 299 10*3/uL (ref 150.0–400.0)
RBC: 4.62 Mil/uL (ref 3.87–5.11)
RDW: 13.4 % (ref 11.5–15.5)
WBC: 8.5 10*3/uL (ref 4.0–10.5)

## 2017-07-05 LAB — COMPREHENSIVE METABOLIC PANEL
ALT: 17 U/L (ref 0–35)
AST: 13 U/L (ref 0–37)
Albumin: 4.5 g/dL (ref 3.5–5.2)
Alkaline Phosphatase: 94 U/L (ref 39–117)
BILIRUBIN TOTAL: 0.3 mg/dL (ref 0.2–1.2)
BUN: 15 mg/dL (ref 6–23)
CALCIUM: 9.8 mg/dL (ref 8.4–10.5)
CHLORIDE: 105 meq/L (ref 96–112)
CO2: 27 meq/L (ref 19–32)
CREATININE: 0.68 mg/dL (ref 0.40–1.20)
GFR: 105.8 mL/min (ref >60.00)
GLUCOSE: 89 mg/dL (ref 70–99)
Potassium: 4.1 mEq/L (ref 3.5–5.1)
Sodium: 139 mEq/L (ref 135–145)
Total Protein: 7 g/dL (ref 6.0–8.3)

## 2017-07-05 LAB — LIPID PANEL
CHOL/HDL RATIO: 2
Cholesterol: 175 mg/dL (ref 0–200)
HDL: 72.8 mg/dL (ref >39.00)
LDL CALC: 90 mg/dL (ref 0–99)
NONHDL: 102.44
TRIGLYCERIDES: 63 mg/dL (ref 0.0–149.0)
VLDL: 12.6 mg/dL (ref 0.0–40.0)

## 2017-07-05 MED ORDER — DICLOFENAC SODIUM 75 MG PO TBEC: 75.0000 mg | DELAYED_RELEASE_TABLET | Freq: Two times a day (BID) | ORAL | 0 refills | Status: DC

## 2017-07-05 NOTE — Progress Notes (Signed)
Subjective  CC:  Chief Complaint  Patient presents with  . Establish Care    Holden from [place], Last Physical in June 2018, HM up to date, Patient is fasting  . Leg Pain    Right Leg, where leg and buttocks met, worse depending on how she sits, 6/10    HPI: Taylor Carey is a 32 y.o. female who presents to [facility] today to establish care with me as a new patient.  I reviewed records from care everywhere.  Had GYN female wellness exam at the end of May.  Normal Pap smear with negative high-risk HPV.  Overall healthy.  G2 P0-0-2-0 with 2 consecutive SABs.  Hopeful to have a family.  Her husband has 2 grown sons.  No fertility issues or genetic issues in her family members.  No history of clotting disorders  She has the following concerns or needs:  Right posterior upper thigh pain.  Started about 5 weeks ago, date of injury approximately 05/27/2017.  She reports localized focal pain worse with sitting on it or pressure.  She notes pain while exercising in certain ways, mainly with lower extremity strength training.  She sits with a cross leg most of the time.  This does exacerbate pain or pulling at that area.  No injury.  No falls.  No low back pain.  No radicular symptoms.  No bowel or bladder dysfunction.  She is seeing a Restaurant manager, fast food.  Advil helps alleviate the pain mildly.  She exercises regularly with strength training, Zumba, machines.  She is not diligent about stretching.  No calf pain, no lower extremity swelling.  Assessment  1. Encounter for routine adult physical exam with abnormal findings   2. Hamstring muscle strain, right, initial encounter      Plan   Female Wellness Visit:  Age appropriate Health Maintenance and Prevention measures were discussed with patient. Included topics are cancer screening recommendations, ways to keep healthy (see AVS) including dietary and exercise recommendations, regular eye and dental  care, use of seat belts, and avoidance of moderate alcohol use and tobacco use.  We also discussed fertility issues.  Recommend getting referral from GYN for maternal-fetal medicine specialist to start evaluation given patient's age.  BMI: discussed patient's BMI and encouraged positive lifestyle modifications to help get to or maintain a target BMI.  HM needs and immunizations were addressed and ordered. See below for orders. See HM and immunization section for updates.  Up-to-date  Routine labs and screening tests ordered including cmp, cbc and lipids where appropriate.  Will complete work physical form and send to her insurance company.  Discussed recommendations regarding Vit D and calcium supplementation (see AVS)    Hamstring strain/tight hamstring: Education given.  Start stretching in formalin.  2-week course of NSAIDs.  Continue with chiropractor.  Follow-up with me if not improved.  Consider physical therapy if needed.  Follow up:  Return in about 1 year (around 07/06/2018) for complete physical. Orders Placed This Encounter  Procedures  . CBC with Differential/Platelet  . Comprehensive metabolic panel  . Lipid panel  . HIV antibody   Meds ordered this encounter  Medications  . diclofenac (VOLTAREN) 75 MG EC tablet    Sig: Take 1 tablet (75 mg total) by mouth 2 (two) times daily.    Dispense:  30 tablet    Refill:  0      We updated and reviewed the patient's past history in detail and it  is documented below.  Patient Active Problem List   Diagnosis Date Noted  . Nephrolithiasis 07/05/2017  . Recurrent UTI (urinary tract infection) 01/29/2014  . GERD (gastroesophageal reflux disease) 05/05/2013   Health Maintenance  Topic Date Due  . INFLUENZA VACCINE  08/18/2017  . TETANUS/TDAP  05/27/2022  . PAP SMEAR  06/16/2022  . HIV Screening  Completed   Immunization History  Administered Date(s) Administered  . HPV Quadrivalent 04/22/2010, 06/23/2010, 10/23/2010  .  Hepatitis B 01/18/2002  . Td 03/10/2002  . Tdap 05/26/2012   Current Meds  Medication Sig  . Aspirin-Acetaminophen-Caffeine (EXCEDRIN MIGRAINE PO) Take 2 tablets by mouth as needed.  Marland Kitchen ibuprofen (ADVIL,MOTRIN) 200 MG tablet Take 400 mg by mouth as needed.  Marland Kitchen omeprazole (PRILOSEC OTC) 20 MG tablet Take 20 mg by mouth daily.  . Prenatal Vit-Fe Fumarate-FA (PRENATAL MULTIVITAMIN) TABS tablet Take 1 tablet by mouth daily at 12 noon.    Allergies: Patient is allergic to bactrim [sulfamethoxazole-trimethoprim]; gardasil [hpv vaccine recombinant (yeast derived)]; and macrobid [nitrofurantoin]. Past Medical History Patient  has a past medical history of Abnormal Pap smear of cervix (05/29/13), GERD (gastroesophageal reflux disease), Insomnia, and Kidney stone. Past Surgical History Patient  has a past surgical history that includes Tonsillectomy and kidney stones. Family History: Patient family history includes Alzheimer's disease in her maternal grandfather; Cancer in her mother; Crohn's disease in her mother; Glaucoma in her mother; Hypertension in her mother; Kidney cancer in her father; Kidney disease in her mother; Parkinson's disease in her paternal grandfather. Social History:  Patient  reports that she has never smoked. She has never used smokeless tobacco. She reports that she drinks about 0.6 - 1.2 oz of alcohol per week. She reports that she does not use drugs.  Review of Systems: Constitutional: negative for fever or malaise Ophthalmic: negative for photophobia, double vision or loss of vision Cardiovascular: negative for chest pain, dyspnea on exertion, or new LE swelling Respiratory: negative for SOB or persistent cough Gastrointestinal: negative for abdominal pain, change in bowel habits or melena Genitourinary: negative for dysuria or gross hematuria Musculoskeletal: negative for new gait disturbance or muscular weakness Integumentary: negative for new or persistent  rashes Neurological: negative for TIA or stroke symptoms Psychiatric: negative for SI or delusions Allergic/Immunologic: negative for hives  Patient Care Team    Relationship Specialty Notifications Start End  Leamon Arnt, MD PCP - General Family Medicine  06/15/17   Regina Eck, CNM Referring Physician Certified Nurse Midwife  08/03/12   Tracey Harries, DC Referring Physician Chiropractic Medicine  07/05/17     Objective  Vitals: BP 112/80   Pulse 83   Temp 98.1 F (36.7 C)   Ht 5' (1.524 m)   Wt 157 lb 3.2 oz (71.3 kg)   LMP 06/10/2017 (Exact Date)   SpO2 98%   BMI 30.70 kg/m  General:  Well developed, well nourished, no acute distress  Psych:  Alert and oriented,normal mood and affect HEENT:  Normocephalic, atraumatic, non-icteric sclera, PERRL, oropharynx is without mass or exudate, supple neck without adenopathy, mass or thyromegaly Cardiovascular:  RRR without gallop, rub or murmur, nondisplaced PMI Respiratory:  Good breath sounds bilaterally, CTAB with normal respiratory effort Gastrointestinal: normal bowel sounds, soft, non-tender, no noted masses. No HSM MSK: no deformities, contusions. Joints are without erythema or swelling, focal tenderness over right ischial tuberosity and proximal hamstring, pain with resisted flexion of hamstring.  Normal strength.  Normal range of motion at hip.  Normal  gait Skin:  Warm, no rashes or suspicious lesions noted Neurologic:    Mental status is normal. Gross motor and sensory exams are normal. Normal gait   Commons side effects, risks, benefits, and alternatives for medications and treatment plan prescribed today were discussed, and the patient expressed understanding of the given instructions. Patient is instructed to call or message via MyChart if he/she has any questions or concerns regarding our treatment plan. No barriers to understanding were identified. We discussed Red Flag symptoms and signs in detail. Patient expressed  understanding regarding what to do in case of urgent or emergency type symptoms.   Medication list was reconciled, printed and provided to the patient in AVS. Patient instructions and summary information was reviewed with the patient as documented in the AVS. This note was prepared with assistance of Dragon voice recognition software. Occasional wrong-word or sound-a-like substitutions may have occurred due to the inherent limitations of voice recognition software

## 2017-07-05 NOTE — Patient Instructions (Signed)
Please return in 12 months for your annual complete physical; please come fasting.  Continue with the chiropractor, stretch and roll out the hamstring and take the antiinflammatory medication for 2 weeks. F/u with me if it is not improving.   It was a pleasure meeting you today! Thank you for choosing us to meet your healthcare needs! I truly look forward to working with you. If you have any questions or concerns, please send me a message via Mychart or call the office at [phone].  Please do these things to maintain good health!   Exercise at least 30-45 minutes a day,  4-5 days a week.   Eat a low-fat diet with lots of fruits and vegetables, up to 7-9 servings per day.  Drink plenty of water daily. Try to drink 8 8oz glasses per day.  Seatbelts can save your life. Always wear your seatbelt.  Place Smoke Detectors on every level of your home and check batteries every year.  Schedule an appointment with an eye doctor for an eye exam every 1-2 years  Safe sex - use condoms to protect yourself from STDs if you could be exposed to these types of infections. Use birth control if you do not want to become pregnant and are sexually active.  Avoid heavy alcohol use. If you drink, keep it to less than 2 drinks/day and not every day.  Health Care Power of Attorney.  Choose someone you trust that could speak for you if you became unable to speak for yourself.  Depression is common in our stressful world.If you're feeling down or losing interest in things you normally enjoy, please come in for a visit.  If anyone is threatening or hurting you, please get help. Physical or Emotional Violence is never OK.     RICE for Routine Care of Injuries Theroutine careofmanyinjuriesincludes rest, ice, compression, and elevation (RICE therapy). RICE therapy is often recommended for injuries to soft tissues, such as a muscle strain, ligament injuries, bruises, and overuse injuries. It can also be  used for some bony injuries. Using RICE therapy can help to relieve pain, lessen swelling, and enable your body to heal. Rest Rest is required to allow your body to heal. This usually involves reducing your normal activities and avoiding use of the injured part of your body. Generally, you can return to your normal activities when you are comfortable and have been given permission by your health care provider. Follow these instructions at home: Ice  Icing your injury helps to keep the swelling down, and it lessens pain. Do not apply ice directly to your skin.  Put ice in a plastic bag.  Place a towel between your skin and the bag.  Leave the ice on for 20 minutes, 2-3 times a day.  Do this for as long as you are directed by your health care provider. Compression Compression means putting pressure on the injured area. Compression helps to keep swelling down, gives support, and helps with discomfort. Compression may be done with an elastic bandage. If an elastic bandage has been applied, follow these general tips:  Remove and reapply the bandage every 3-4 hours or as directed by your health care provider.  Make sure the bandage is not wrapped too tightly, because this can cut off circulation. If part of your body beyond the bandage becomes blue, numb, cold, swollen, or more painful, your bandage is most likely too tight. If this occurs, remove your bandage and reapply it more loosely.  See  your health care provider if the bandage seems to be making your problems worse rather than better.  Elevation  Elevation means keeping the injured area raised. This helps to lessen swelling and decrease pain. If possible, your injured area should be elevated at or above the level of your heart or the center of your chest. When should I seek medical care?  If your pain and swelling continue.  If your symptoms are getting worse rather than improving. These symptoms may indicate that further evaluation  or further X-rays are needed. Sometimes, X-rays may not show a small broken bone (fracture) until a number of days later. Make a follow-up appointment with your health care provider. When should I seek immediate medical care?  If you have sudden severe pain at or below the area of your injury.  If you have redness or increased swelling around your injury.  If you have tingling or numbness at or below the area of your injury that does not improve after you remove the elastic bandage. This information is not intended to replace advice given to you by your health care provider. Make sure you discuss any questions you have with your health care provider. Document Released: 04/18/2000 Document Revised: 02/16/2016 Document Reviewed: 12/12/2013 Elsevier Interactive Patient Education  [employer].

## 2017-07-06 LAB — HIV ANTIBODY (ROUTINE TESTING W REFLEX): HIV: NONREACTIVE

## 2017-07-22 ENCOUNTER — Other Ambulatory Visit: Payer: Self-pay | Admitting: Family Medicine

## 2017-08-01 ENCOUNTER — Telehealth: Payer: Self-pay | Admitting: *Deleted

## 2017-08-01 ENCOUNTER — Encounter: Payer: Self-pay | Admitting: Certified Nurse Midwife

## 2017-08-01 NOTE — Telephone Encounter (Signed)
Last AEX 06-15-17. Discussed MFM consult due to history of 2 SAB.  Now requesting referral.   Routing to Lovett Soxebbi Leonard, CNM.

## 2017-08-01 NOTE — Telephone Encounter (Signed)
My Chart message from patient:  ----- Message from Mychart, Generic sent at 08/01/2017 11:25 AM EDT -----    I wanted to follow up after our last visit. I think I have decided to pursue going to a visit with maternal fetal medicine. I have called them and waiting on a call back about the cost of the visit. But I wanted to go ahead and reach out to you to ask to make the referral. Also- I did have my physical in June and all looks good from that end- nothing came up that needs to be addressed. Thank you for your help and encouragement.

## 2017-08-01 NOTE — Telephone Encounter (Signed)
See phone note created for this encounter.

## 2017-08-02 ENCOUNTER — Other Ambulatory Visit: Payer: Self-pay | Admitting: Certified Nurse Midwife

## 2017-08-04 NOTE — Telephone Encounter (Signed)
Please advise on referral to MFM.

## 2017-08-05 ENCOUNTER — Other Ambulatory Visit: Payer: Self-pay | Admitting: Certified Nurse Midwife

## 2017-08-05 DIAGNOSIS — O2620 Pregnancy care for patient with recurrent pregnancy loss, unspecified trimester: Secondary | ICD-10-CM

## 2017-08-05 NOTE — Telephone Encounter (Signed)
Referral has been made.

## 2017-08-05 NOTE — Telephone Encounter (Signed)
Call to patient. Per ROI, can leave message on voice mail. Left message that referral to MFM as been ordered as she requested through My Chart. She should hear additional information next week. If additional questions, can call back and ask for referral department.   CC: Taylor Carey.   Encounter closed.

## 2017-08-09 ENCOUNTER — Telehealth: Payer: Self-pay | Admitting: Certified Nurse Midwife

## 2017-08-09 NOTE — Telephone Encounter (Signed)
Call placed to patient to convey appointment information. Left a voicemail message requesting a return call. Upon return call will need to convey the following information:  Patient is scheduled with Maternal Fetal Medicine on 08/15/17 at 1:30 PM. She will need to arrive 15 minutes early for check-in.  If patient needs to reschedule appointment, she can contact their office at [phone]   cc: Leota Sauerseborah Leonard, CNM        [facility] P[facility] Davis

## 2017-08-09 NOTE — Telephone Encounter (Signed)
Patient returned call. Conveyed appointment information for Maternal Fetal Medicine. Patient advises she may have a conflict with this appointment date. I have provided the patient with the phone number to their office for re-scheduling if needed.  Routing to Leota Sauerseborah Leonard, CNM for final review. Patient is agreeable to disposition. Will close encounter and referral   cc: Soundra Pilonosa Davis

## 2017-08-15 ENCOUNTER — Ambulatory Visit (HOSPITAL_COMMUNITY): Payer: BLUE CROSS/BLUE SHIELD

## 2017-09-06 ENCOUNTER — Encounter (HOSPITAL_COMMUNITY): Payer: BLUE CROSS/BLUE SHIELD

## 2018-02-27 ENCOUNTER — Telehealth: Payer: Self-pay | Admitting: Certified Nurse Midwife

## 2018-02-27 NOTE — Telephone Encounter (Signed)
Routing to Leota Sauers, CNM to review and advise.   Cc: Soundra Pilon

## 2018-02-27 NOTE — Telephone Encounter (Signed)
Patient sent the following correspondence through MyChart. Routing to triage to assist patient with request.  Good Morning,     I am following up on a referral to maternal fetal medicine. I have an appointment scheduled scheduled but the preservices department has not called me back about cost/insurance and I really havent been happy to with initial experience with this office, I tried at the end of the last year and had the same experience. Is there any testing that can be done in your office to help get an understanding of why I may be having miscarriages or check my hormone levels? Or is there somewhere else I can be referred? Could you please call me at your convenience? Thank you.

## 2018-02-28 NOTE — Telephone Encounter (Signed)
Has she actually had a visit? If not we could refer to MFM in [facility]

## 2018-02-28 NOTE — Telephone Encounter (Signed)
Left message to call Romero Letizia, RN at [facility] [phone].   

## 2018-03-01 NOTE — Telephone Encounter (Signed)
Left message to call Noreene Larsson, RN at [facility] [phone].     [facility] MFM Locations [facility] [address] [place], [place] [address]  Comprehensive [facility]etal [facility] - [facility] [phone] [address] [place] [address]  [phone]

## 2018-03-01 NOTE — Telephone Encounter (Signed)
Patient returning call.

## 2018-03-01 NOTE — Telephone Encounter (Signed)
Patient returned call to nurse Jill. °

## 2018-03-01 NOTE — Telephone Encounter (Signed)
Spoke with patient, advised as seen below per Leota Sauers, CNM. Patient has not been seen at [facility] to date, has an appt 03/2018, "plans to cancel". Patient request contact information be sent via MyChart message. Patient will f/u with insurance provider and notify office how she would like to proceed. Patient verbalizes understanding.   MyChart message to patient.

## 2018-03-06 NOTE — Telephone Encounter (Signed)
Routing to Deborah Leonard, CNM FYI.   Encounter closed.  

## 2018-03-16 ENCOUNTER — Telehealth: Payer: Self-pay | Admitting: Certified Nurse Midwife

## 2018-03-16 DIAGNOSIS — O2620 Pregnancy care for patient with recurrent pregnancy loss, unspecified trimester: Secondary | ICD-10-CM

## 2018-03-16 NOTE — Telephone Encounter (Signed)
See previous telephone encounter dated 02/27/18.   Ambulatory referral placed to MFM. Patient notified via MyChart message.   Routing to [employer].   Encounter closed.   Cc: Leota Sauers, CNM

## 2018-03-16 NOTE — Telephone Encounter (Signed)
Patient sent the following correspondence through MyChart. Routing to triage to assist patient with request.  Cc: Clotilde Dieter  Thank you for having Nurse Noreene Larsson call to follow up about my referral concerns, I went to reply to her message but it says the message was too old to reply too. I have called and confirmed with my insurance company and would like to proceed with the referral below. I have researched 3 doctors at that location [facility]) and would be open to seeing Dr. Gavin Potters, Dr. Sherrie George, or Dr. Sande Brothers. Thank you for your help.     [name] Hug[place]  [phone]      [name],    Here is the information you have requested, please let us know how you would like to proceed. Have a great day!    [facility] MFM Locations  Dr. Gavin Potters  [facility]  [address]  [address]  [place], [place] [address]    [facility] - [facility] [employer]  [phone] [address] [place] [address]    [phone]  [phone]    Sincerely,   Noreene Larsson, RN

## 2018-03-23 ENCOUNTER — Encounter (HOSPITAL_COMMUNITY): Payer: BLUE CROSS/BLUE SHIELD

## 2018-05-29 ENCOUNTER — Other Ambulatory Visit: Payer: Self-pay

## 2018-05-30 ENCOUNTER — Other Ambulatory Visit: Payer: Self-pay | Admitting: Obstetrics and Gynecology

## 2018-05-30 ENCOUNTER — Ambulatory Visit: Payer: BLUE CROSS/BLUE SHIELD | Admitting: Obstetrics and Gynecology

## 2018-05-30 ENCOUNTER — Telehealth: Payer: Self-pay

## 2018-05-30 ENCOUNTER — Encounter: Payer: Self-pay | Admitting: Obstetrics and Gynecology

## 2018-05-30 VITALS — BP 110/68 | HR 84 | Temp 98.3°F | Resp 14 | Ht 60.0 in | Wt 160.0 lb

## 2018-05-30 DIAGNOSIS — Z3490 Encounter for supervision of normal pregnancy, unspecified, unspecified trimester: Secondary | ICD-10-CM

## 2018-05-30 DIAGNOSIS — Z3201 Encounter for pregnancy test, result positive: Secondary | ICD-10-CM

## 2018-05-30 LAB — BETA HCG QUANT (REF LAB): hCG Quant: 6794 m[IU]/mL

## 2018-05-30 NOTE — Progress Notes (Signed)
110GYNECOLOGY  VISIT   HPI: 33 y.o.   Married  Caucasian  female   G2P0020 with Patient's last menstrual period was 04/20/2018.   here for positive UPT.  Had a positive test 3 days ago.  Started trying for pregnancy in December.  She had 2 miscarriages.  These occurred the beginning 2018 and then in August 2018 at 6 - 7 weeks.  Passed the tissue on her own.   In March, she started seeing Dr. Gavin PottersGrandis, MFM, at [facility].  She had some blood work, which was normal.  Nothing further was done due to Covid 19.  Menses are usually 28 days apart.   She denies bleeding or cramping.  Some breast tenderness and a little fatigue.   UPT:  Positive    GYNECOLOGIC HISTORY: Patient's last menstrual period was 04/20/2018. Contraception:  none Menopausal hormone therapy:   n/a Last mammogram:  n/a Last pap smear:   06/15/17 Neg:Neg HR HPV        OB History    Gravida  3   Para      Term      Preterm      AB  2   Living        SAB  2   TAB      Ectopic      Multiple      Live Births                 Patient Active Problem List   Diagnosis Date Noted  . Nephrolithiasis 07/05/2017  . Recurrent UTI (urinary tract infection) 01/29/2014  . GERD (gastroesophageal reflux disease) 05/05/2013    Past Medical History:  Diagnosis Date  . Abnormal Pap smear of cervix 05/29/13   ASCUS +HPV HR  . GERD (gastroesophageal reflux disease)   . Insomnia   . Kidney stone     Past Surgical History:  Procedure Laterality Date  . kidney stones    . TONSILLECTOMY      Current Outpatient Medications  Medication Sig Dispense Refill  . doxylamine, Sleep, (UNISOM SLEEPTABS) 25 MG tablet     . Magnesium 500 MG TABS     . Omega-3 Fatty Acids (FISH OIL) 1000 MG CAPS     . omeprazole (PRILOSEC OTC) 20 MG tablet Take 20 mg by mouth daily.    . Prenatal Vit-Fe Fumarate-FA (PRENATAL MULTIVITAMIN) TABS tablet Take 1 tablet by mouth daily at 12 noon.     No  current facility-administered medications for this visit.      ALLERGIES: Bactrim [sulfamethoxazole-trimethoprim]; Gardasil [hpv 4-valent vaccine recombinant vaccine]; and Macrobid [nitrofurantoin]  Family History  Problem Relation Age of Onset  . Crohn's disease Mother   . Hypertension Mother   . Kidney disease Mother   . Cancer Mother        female cancer  . Glaucoma Mother   . Kidney cancer Father   . Alzheimer's disease Maternal Grandfather   . Parkinson's disease Paternal Grandfather   . Colon cancer Neg Hx     Social History   Socioeconomic History  . Marital status: Married    Spouse name: Not on file  . Number of children: Not on file  . Years of education: Not on file  . Highest education level: Not on file  Occupational History    Employer: [employer]  Social Needs  . Financial resource strain: Not on file  . Food insecurity:    Worry: Not on file  Inability: Not on file  . Transportation needs:    Medical: Not on file    Non-medical: Not on file  Tobacco Use  . Smoking status: Never Smoker  . Smokeless tobacco: Never Used  Substance and Sexual Activity  . Alcohol use: Yes    Alcohol/week: 1.0 - 2.0 standard drinks    Types: 1 - 2 Standard drinks or equivalent per week  . Drug use: No  . Sexual activity: Yes    Partners: Male    Birth control/protection: None  Lifestyle  . Physical activity:    Days per week: Not on file    Minutes per session: Not on file  . Stress: Not on file  Relationships  . Social connections:    Talks on phone: Not on file    Gets together: Not on file    Attends religious service: Not on file    Active member of club or organization: Not on file    Attends meetings of clubs or organizations: Not on file    Relationship status: Not on file  . Intimate partner violence:    Fear of current or ex partner: Not on file    Emotionally abused: Not on file    Physically abused: Not on file    Forced sexual activity:  Not on file  Other Topics Concern  . Not on file  Social History Narrative   Taylor Carey   Married   No children   Fun: zumba, work out    Review of Systems  Constitutional: Negative.   HENT: Negative.   Eyes: Negative.   Respiratory: Negative.   Cardiovascular: Negative.   Gastrointestinal: Negative.   Endocrine: Negative.   Genitourinary: Negative.   Musculoskeletal: Negative.   Skin: Negative.   Allergic/Immunologic: Negative.   Neurological: Negative.   Hematological: Negative.   Psychiatric/Behavioral: Negative.     PHYSICAL EXAMINATION:    BP 110/68 (BP Location: Right Arm, Patient Position: Sitting, Cuff Size: Large)   Pulse 84   Temp 98.3 F (36.8 C) (Temporal)   Resp 14   Ht 5' (1.524 m)   Wt 160 lb (72.6 kg)   LMP 04/20/2018   BMI 31.25 kg/m     General appearance: alert, cooperative and appears stated age   Pelvic: External genitalia:  no lesions              Urethra:  normal appearing urethra with no masses, tenderness or lesions              Bartholins and Skenes: normal                 Vagina: normal appearing vagina with normal color and discharge, no lesions              Cervix: no lesions                Bimanual Exam:  Uterus:  normal size, contour, position, consistency, mobility, non-tender              Adnexa: no mass, fullness, tenderness        Chaperone was present for exam.  ASSESSMENT  5 weeks and 5 days gestation.  Hx SAB x 2.  PLAN  Will do serial hCGs and then [place] when over 2000.  Continue PNV.  Avoid exposures.  We reviewed What to Expect When Expecting.  We discussed current guidelines in Up To Date about progesterone supplementation not currently being recommended as it does not improve live  birth rate.  An After Visit Summary was printed and given to the patient.  __25____ minutes face to face time of which over 50% was spent in counseling.

## 2018-05-30 NOTE — Telephone Encounter (Signed)
Spoke with patient. Advised beta hcg is 6794 which is great. Reviewed with Dr.Silva and level does not need to be repeated this week. Viability scan scheduled for 06/01/2018 at 1:30 pm with 2 pm consult with Dr.Silva. Patient is agreeable to date and time. Ultrasound ordered.   Cc: Harland Dingwall  Routing to provider and will close encounter.

## 2018-05-31 ENCOUNTER — Other Ambulatory Visit: Payer: Self-pay

## 2018-06-01 ENCOUNTER — Ambulatory Visit: Payer: BLUE CROSS/BLUE SHIELD | Admitting: Obstetrics and Gynecology

## 2018-06-01 ENCOUNTER — Encounter: Payer: Self-pay | Admitting: Obstetrics and Gynecology

## 2018-06-01 ENCOUNTER — Ambulatory Visit (INDEPENDENT_AMBULATORY_CARE_PROVIDER_SITE_OTHER): Payer: BLUE CROSS/BLUE SHIELD

## 2018-06-01 ENCOUNTER — Other Ambulatory Visit: Payer: Self-pay

## 2018-06-01 VITALS — BP 100/68 | HR 68 | Temp 98.2°F | Resp 16 | Wt 163.0 lb

## 2018-06-01 DIAGNOSIS — O418X10 Other specified disorders of amniotic fluid and membranes, first trimester, not applicable or unspecified: Secondary | ICD-10-CM | POA: Diagnosis not present

## 2018-06-01 DIAGNOSIS — O468X1 Other antepartum hemorrhage, first trimester: Secondary | ICD-10-CM

## 2018-06-01 DIAGNOSIS — Z3490 Encounter for supervision of normal pregnancy, unspecified, unspecified trimester: Secondary | ICD-10-CM | POA: Diagnosis not present

## 2018-06-01 NOTE — Patient Instructions (Signed)
You have a small subchorionic hemorrhage noted on the ultrasound today.  The baby is developing well and has a strong heart beat! We are checking your blood type today.  If you are Rh negative, then you will need a vaccine called Rhogam, which is administered a the hospital.  Please call your OB office to make a first appointment.

## 2018-06-01 NOTE — Progress Notes (Signed)
GYNECOLOGY  VISIT   HPI: 33 y.o.   Married  Caucasian  female   G3P0020 with Patient's last menstrual period was 04/20/2018.   here for viability scan & consult    HCG 6794 on 05/30/18.  Denies bleeding.  Hx SAB x 2.   GYNECOLOGIC HISTORY: Patient's last menstrual period was 04/20/2018. Contraception:  none Menopausal hormone therapy:  none Last mammogram:  none Last pap smear:   06-15-17 neg HPV HR neg        OB History    Gravida  3   Para      Term      Preterm      AB  2   Living        SAB  2   TAB      Ectopic      Multiple      Live Births                 Patient Active Problem List   Diagnosis Date Noted  . Nephrolithiasis 07/05/2017  . Recurrent UTI (urinary tract infection) 01/29/2014  . GERD (gastroesophageal reflux disease) 05/05/2013    Past Medical History:  Diagnosis Date  . Abnormal Pap smear of cervix 05/29/13   ASCUS +HPV HR  . GERD (gastroesophageal reflux disease)   . Insomnia   . Kidney stone     Past Surgical History:  Procedure Laterality Date  . kidney stones    . TONSILLECTOMY      Current Outpatient Medications  Medication Sig Dispense Refill  . Magnesium 500 MG TABS     . Omega-3 Fatty Acids (FISH OIL) 1000 MG CAPS     . omeprazole (PRILOSEC OTC) 20 MG tablet Take 20 mg by mouth daily.    . Prenatal Vit-Fe Fumarate-FA (PRENATAL MULTIVITAMIN) TABS tablet Take 1 tablet by mouth daily at 12 noon.     No current facility-administered medications for this visit.      ALLERGIES: Bactrim [sulfamethoxazole-trimethoprim]; Gardasil [hpv 4-valent vaccine recombinant vaccine]; and Macrobid [nitrofurantoin]  Family History  Problem Relation Age of Onset  . Crohn's disease Mother   . Hypertension Mother   . Kidney disease Mother   . Cancer Mother        female cancer  . Glaucoma Mother   . Kidney cancer Father   . Alzheimer's disease Maternal Grandfather   . Parkinson's disease Paternal Grandfather   . Colon  cancer Neg Hx     Social History   Socioeconomic History  . Marital status: Married    Spouse name: Not on file  . Number of children: Not on file  . Years of education: Not on file  . Highest education level: Not on file  Occupational History    Employer: [employer]  Social Needs  . Financial resource strain: Not on file  . Food insecurity:    Worry: Not on file    Inability: Not on file  . Transportation needs:    Medical: Not on file    Non-medical: Not on file  Tobacco Use  . Smoking status: Never Smoker  . Smokeless tobacco: Never Used  Substance and Sexual Activity  . Alcohol use: Not Currently  . Drug use: No  . Sexual activity: Yes    Partners: Male    Birth control/protection: None  Lifestyle  . Physical activity:    Days per week: Not on file    Minutes per session: Not on file  . Stress: Not  on file  Relationships  . Social connections:    Talks on phone: Not on file    Gets together: Not on file    Attends religious service: Not on file    Active member of club or organization: Not on file    Attends meetings of clubs or organizations: Not on file    Relationship status: Not on file  . Intimate partner violence:    Fear of current or ex partner: Not on file    Emotionally abused: Not on file    Physically abused: Not on file    Forced sexual activity: Not on file  Other Topics Concern  . Not on file  Social History Narrative   Einar PheasantBanker   Married   No children   Fun: zumba, work out    Review of Systems  Constitutional: Negative.   HENT: Negative.   Eyes: Negative.   Respiratory: Negative.   Cardiovascular: Negative.   Gastrointestinal: Negative.   Endocrine: Negative.   Genitourinary: Negative.   Musculoskeletal: Negative.   Skin: Negative.   Allergic/Immunologic: Negative.   Neurological: Negative.   Hematological: Negative.   Psychiatric/Behavioral: Negative.     PHYSICAL EXAMINATION:    BP 100/68   Pulse 68   Temp 98.2  F (36.8 C) (Oral)   Resp 16   Wt 163 lb (73.9 kg)   LMP 04/20/2018   BMI 31.83 kg/m     General appearance: alert, cooperative and appears stated age  OB US IUP with size equal date. FH 114 BPM.  SCH noted - 5 x 7 x 18 mm.  Left CL cyst.   Chaperone was present for exam.  ASSESSMENT  Early viable pregnancy.  Subchorionic hemorrhage noted.  Hx SAB x 2.  PLAN  [facility]  Will check bloody type and Rh and give Rhogam if Rh negative.  She will establish care with OB office and return after her postpartum care is complete.    An After Visit Summary was printed and given to the patient.  ___15___ minutes face to face time of which over 50% was spent in counseling.

## 2018-06-01 NOTE — Progress Notes (Signed)
Encounter reviewed by Dr. Brook Amundson C. Silva.  

## 2018-06-02 ENCOUNTER — Telehealth: Payer: Self-pay | Admitting: Obstetrics and Gynecology

## 2018-06-02 LAB — ABO AND RH: Rh Factor: POSITIVE

## 2018-06-02 NOTE — Telephone Encounter (Signed)
Patient returning call.

## 2018-06-02 NOTE — Telephone Encounter (Signed)
Spoke with patient. Advised blood type is B positive. Patient does not need Rhogam injection. Patient verbalizes understanding.  Routing to provider and will close encounter.

## 2018-06-02 NOTE — Telephone Encounter (Signed)
Left message to call Franca Stakes at [phone]. 

## 2018-06-02 NOTE — Telephone Encounter (Signed)
Patient is requesting results from yesterday's appointment.

## 2018-06-22 ENCOUNTER — Ambulatory Visit: Payer: BLUE CROSS/BLUE SHIELD | Admitting: Certified Nurse Midwife

## 2018-06-23 LAB — OB RESULTS CONSOLE HIV ANTIBODY (ROUTINE TESTING): HIV: NONREACTIVE

## 2018-06-23 LAB — OB RESULTS CONSOLE ANTIBODY SCREEN: Antibody Screen: NEGATIVE

## 2018-06-23 LAB — OB RESULTS CONSOLE RUBELLA ANTIBODY, IGM: Rubella: IMMUNE

## 2018-06-23 LAB — OB RESULTS CONSOLE ABO/RH: RH Type: POSITIVE

## 2018-06-23 LAB — OB RESULTS CONSOLE RPR: RPR: NONREACTIVE

## 2018-06-23 LAB — OB RESULTS CONSOLE HEPATITIS B SURFACE ANTIGEN: Hepatitis B Surface Ag: NEGATIVE

## 2018-06-23 LAB — OB RESULTS CONSOLE GC/CHLAMYDIA
Chlamydia: NEGATIVE
Gonorrhea: NEGATIVE

## 2019-01-10 ENCOUNTER — Other Ambulatory Visit: Payer: Self-pay | Admitting: Obstetrics and Gynecology

## 2019-01-15 ENCOUNTER — Encounter (HOSPITAL_COMMUNITY): Payer: Self-pay

## 2019-01-15 ENCOUNTER — Encounter (HOSPITAL_COMMUNITY): Payer: Self-pay | Admitting: *Deleted

## 2019-01-15 NOTE — Patient Instructions (Signed)
Taylor Carey  01/15/2019   Your procedure is scheduled on:  02/17/2018  Arrive at 3:00 PM at Entrance C on [address] at [facility]  and [address]. You are invited to use the FREE valet parking or use the Visitor's parking deck.  Pick up the phone at the desk and dial [phone].  Call this number if you have problems the morning of surgery: [phone]  Remember:   Do not eat food:(After Midnight) Desps de medianoche.  Do not drink clear liquids: (After Midnight) Desps de medianoche.  Take these medicines the morning of surgery with A SIP OF WATER:  none   Do not wear jewelry, make-up or nail polish.  Do not wear lotions, powders, or perfumes. Do not wear deodorant.  Do not shave 48 hours prior to surgery.  Do not bring valuables to the hospital.  [facility] is not   responsible for any belongings or valuables brought to the hospital.  Contacts, dentures or bridgework may not be worn into surgery.  Leave suitcase in the car. After surgery it may be brought to your room.  For patients admitted to the hospital, checkout time is 11:00 AM the day of              discharge.      Please read over the following fact sheets that you were given:     Preparing for Surgery

## 2019-01-16 ENCOUNTER — Other Ambulatory Visit: Payer: Self-pay

## 2019-01-16 ENCOUNTER — Other Ambulatory Visit (HOSPITAL_COMMUNITY)
Admission: RE | Admit: 2019-01-16 | Discharge: 2019-01-16 | Disposition: A | Discharge status: Home / Self Care | Payer: BC Managed Care – PPO | Source: Ambulatory Visit | Attending: Obstetrics and Gynecology | Admitting: Obstetrics and Gynecology

## 2019-01-16 DIAGNOSIS — O321XX0 Maternal care for breech presentation, not applicable or unspecified: Secondary | ICD-10-CM | POA: Diagnosis not present

## 2019-01-16 DIAGNOSIS — Z01812 Encounter for preprocedural laboratory examination: Secondary | ICD-10-CM | POA: Diagnosis not present

## 2019-01-16 DIAGNOSIS — Z20828 Contact with and (suspected) exposure to other viral communicable diseases: Secondary | ICD-10-CM | POA: Insufficient documentation

## 2019-01-16 LAB — CBC
HCT: 36.4 % (ref 36.0–46.0)
Hemoglobin: 11.6 g/dL — ABNORMAL LOW (ref 12.0–15.0)
MCH: 26.2 pg (ref 26.0–34.0)
MCHC: 31.9 g/dL (ref 30.0–36.0)
MCV: 82.2 fL (ref 80.0–100.0)
Platelets: 280 10*3/uL (ref 150–400)
RBC: 4.43 MIL/uL (ref 3.87–5.11)
RDW: 14.5 % (ref 11.5–15.5)
WBC: 11.2 10*3/uL — ABNORMAL HIGH (ref 4.0–10.5)
nRBC: 0 % (ref 0.0–0.2)

## 2019-01-16 LAB — TYPE AND SCREEN
ABO/RH(D): B POS
Antibody Screen: NEGATIVE

## 2019-01-16 LAB — SARS CORONAVIRUS 2 (TAT 6-24 HRS): SARS Coronavirus 2: NEGATIVE

## 2019-01-16 LAB — ABO/RH: ABO/RH(D): B POS

## 2019-01-16 NOTE — MAU Note (Signed)
Covid swab collected. PT tolerated well.Pt asymptomatic 

## 2019-01-17 LAB — RPR: RPR Ser Ql: NONREACTIVE

## 2019-01-17 NOTE — Anesthesia Preprocedure Evaluation (Addendum)
Anesthesia Evaluation  Patient identified by MRN, date of birth, ID band Patient awake    Reviewed: Allergy & Precautions, Patient's Chart, lab work & pertinent test results  Airway Mallampati: II  TM Distance: >3 FB Neck ROM: Full    Dental no notable dental hx. (+) Teeth Intact, Dental Advisory Given   Pulmonary neg pulmonary ROS,    Pulmonary exam normal breath sounds clear to auscultation       Cardiovascular negative cardio ROS Normal cardiovascular exam Rhythm:Regular Rate:Normal     Neuro/Psych negative neurological ROS  negative psych ROS   GI/Hepatic Neg liver ROS, GERD  Medicated and Controlled,  Endo/Other  Morbid obesityBMI 42  Renal/GU Renal diseaseHx nephrolithiasis  negative genitourinary   Musculoskeletal negative musculoskeletal ROS (+)   Abdominal Normal abdominal exam  (+)   Peds negative pediatric ROS (+)  Hematology negative hematology ROS (+)   Anesthesia Other Findings   Reproductive/Obstetrics (+) Pregnancy Breech presentation                            Anesthesia Physical Anesthesia Plan  ASA: II  Anesthesia Plan: Spinal   Post-op Pain Management:    Induction:   PONV Risk Score and Plan: 2 and Ondansetron and Treatment may vary due to age or medical condition  Airway Management Planned: Natural Airway and Simple Face Mask  Additional Equipment: None  Intra-op Plan:   Post-operative Plan: Extubation in OR  Informed Consent: I have reviewed the patients History and Physical, chart, labs and discussed the procedure including the risks, benefits and alternatives for the proposed anesthesia with the patient or authorized representative who has indicated his/her understanding and acceptance.     Dental advisory given  Plan Discussed with: CRNA  Anesthesia Plan Comments:         Anesthesia Quick Evaluation

## 2019-01-18 ENCOUNTER — Inpatient Hospital Stay (HOSPITAL_COMMUNITY)
Admission: RE | Admit: 2019-01-18 | Discharge: 2019-01-21 | DRG: 787 | Disposition: A | Discharge status: Home / Self Care | Payer: BC Managed Care – PPO | Attending: Obstetrics and Gynecology | Admitting: Obstetrics and Gynecology

## 2019-01-18 ENCOUNTER — Encounter (HOSPITAL_COMMUNITY)
Admission: RE | Disposition: A | Discharge status: Home / Self Care | Payer: Self-pay | Source: Home / Self Care | Attending: Obstetrics and Gynecology

## 2019-01-18 ENCOUNTER — Inpatient Hospital Stay (HOSPITAL_COMMUNITY): Payer: BC Managed Care – PPO | Admitting: Anesthesiology

## 2019-01-18 ENCOUNTER — Encounter (HOSPITAL_COMMUNITY): Payer: Self-pay | Admitting: Obstetrics and Gynecology

## 2019-01-18 ENCOUNTER — Other Ambulatory Visit: Payer: Self-pay

## 2019-01-18 DIAGNOSIS — O321XX0 Maternal care for breech presentation, not applicable or unspecified: Secondary | ICD-10-CM | POA: Diagnosis present

## 2019-01-18 DIAGNOSIS — Z98891 History of uterine scar from previous surgery: Secondary | ICD-10-CM

## 2019-01-18 DIAGNOSIS — Z3A39 39 weeks gestation of pregnancy: Secondary | ICD-10-CM

## 2019-01-18 DIAGNOSIS — O4103X0 Oligohydramnios, third trimester, not applicable or unspecified: Secondary | ICD-10-CM | POA: Diagnosis present

## 2019-01-18 DIAGNOSIS — O9902 Anemia complicating childbirth: Secondary | ICD-10-CM | POA: Diagnosis present

## 2019-01-18 DIAGNOSIS — D649 Anemia, unspecified: Secondary | ICD-10-CM | POA: Diagnosis present

## 2019-01-18 SURGERY — Surgical Case
Anesthesia: Spinal

## 2019-01-18 MED ORDER — NALBUPHINE HCL 10 MG/ML IJ SOLN: 5.0000 mg | INTRAMUSCULAR | Status: DC | PRN

## 2019-01-18 MED ORDER — DIBUCAINE (PERIANAL) 1 % EX OINT: 1.0000 "application " | TOPICAL_OINTMENT | CUTANEOUS | Status: DC | PRN

## 2019-01-18 MED ORDER — DIPHENHYDRAMINE HCL 50 MG/ML IJ SOLN
INTRAMUSCULAR | Status: AC
  Filled 2019-01-18: qty 1

## 2019-01-18 MED ORDER — BUPIVACAINE HCL (PF) 0.25 % IJ SOLN
INTRAMUSCULAR | Status: AC
  Filled 2019-01-18: qty 20

## 2019-01-18 MED ORDER — SODIUM CHLORIDE 0.9% FLUSH: 3.0000 mL | INTRAVENOUS | Status: DC | PRN

## 2019-01-18 MED ORDER — PRENATAL MULTIVITAMIN CH
1.0000 | ORAL_TABLET | Freq: Every day | ORAL | Status: DC
  Administered 2019-01-19 – 2019-01-21 (×3): 1 via ORAL
  Filled 2019-01-18 (×3): qty 1

## 2019-01-18 MED ORDER — LACTATED RINGERS IV SOLN: INTRAVENOUS | Status: DC

## 2019-01-18 MED ORDER — NALBUPHINE HCL 10 MG/ML IJ SOLN: 5.0000 mg | Freq: Once | INTRAMUSCULAR | Status: DC | PRN

## 2019-01-18 MED ORDER — MENTHOL 3 MG MT LOZG: 1.0000 | LOZENGE | OROMUCOSAL | Status: DC | PRN

## 2019-01-18 MED ORDER — KETOROLAC TROMETHAMINE 30 MG/ML IJ SOLN: 30.0000 mg | Freq: Once | INTRAMUSCULAR | Status: DC | PRN

## 2019-01-18 MED ORDER — ONDANSETRON HCL 4 MG/2ML IJ SOLN
INTRAMUSCULAR | Status: DC | PRN
  Administered 2019-01-18: 4 mg via INTRAVENOUS

## 2019-01-18 MED ORDER — MEPERIDINE HCL 25 MG/ML IJ SOLN: 6.2500 mg | INTRAMUSCULAR | Status: DC | PRN

## 2019-01-18 MED ORDER — BUPIVACAINE IN DEXTROSE 0.75-8.25 % IT SOLN
INTRATHECAL | Status: DC | PRN
  Administered 2019-01-18: 1.4 mL via INTRATHECAL

## 2019-01-18 MED ORDER — SIMETHICONE 80 MG PO CHEW
80.0000 mg | CHEWABLE_TABLET | ORAL | Status: DC
  Administered 2019-01-18 – 2019-01-21 (×3): 80 mg via ORAL
  Filled 2019-01-18 (×3): qty 1

## 2019-01-18 MED ORDER — FENTANYL CITRATE (PF) 100 MCG/2ML IJ SOLN
INTRAMUSCULAR | Status: AC
  Filled 2019-01-18: qty 2

## 2019-01-18 MED ORDER — PHENYLEPHRINE HCL-NACL 20-0.9 MG/250ML-% IV SOLN
INTRAVENOUS | Status: AC
  Filled 2019-01-18: qty 250

## 2019-01-18 MED ORDER — MORPHINE SULFATE (PF) 0.5 MG/ML IJ SOLN
INTRAMUSCULAR | Status: DC | PRN
  Administered 2019-01-18: .15 mg via INTRATHECAL

## 2019-01-18 MED ORDER — SODIUM CHLORIDE 0.9 % IV SOLN: INTRAVENOUS | Status: DC | PRN

## 2019-01-18 MED ORDER — SIMETHICONE 80 MG PO CHEW
80.0000 mg | CHEWABLE_TABLET | Freq: Three times a day (TID) | ORAL | Status: DC
  Administered 2019-01-19 – 2019-01-21 (×7): 80 mg via ORAL
  Filled 2019-01-18 (×7): qty 1

## 2019-01-18 MED ORDER — METHYLERGONOVINE MALEATE 0.2 MG PO TABS: 0.2000 mg | ORAL_TABLET | ORAL | Status: DC | PRN

## 2019-01-18 MED ORDER — ONDANSETRON HCL 4 MG/2ML IJ SOLN
INTRAMUSCULAR | Status: AC
  Filled 2019-01-18: qty 2

## 2019-01-18 MED ORDER — SCOPOLAMINE 1 MG/3DAYS TD PT72
1.0000 | MEDICATED_PATCH | Freq: Once | TRANSDERMAL | Status: AC
  Administered 2019-01-18: 1.5 mg via TRANSDERMAL

## 2019-01-18 MED ORDER — OXYCODONE HCL 5 MG/5ML PO SOLN: 5.0000 mg | Freq: Once | ORAL | Status: DC | PRN

## 2019-01-18 MED ORDER — NALOXONE HCL 0.4 MG/ML IJ SOLN: 0.4000 mg | INTRAMUSCULAR | Status: DC | PRN

## 2019-01-18 MED ORDER — CEFAZOLIN SODIUM-DEXTROSE 2-4 GM/100ML-% IV SOLN
INTRAVENOUS | Status: AC
  Filled 2019-01-18: qty 100

## 2019-01-18 MED ORDER — OXYTOCIN 40 UNITS IN NORMAL SALINE INFUSION - SIMPLE MED
INTRAVENOUS | Status: AC
  Filled 2019-01-18: qty 2000

## 2019-01-18 MED ORDER — IBUPROFEN 800 MG PO TABS
800.0000 mg | ORAL_TABLET | Freq: Four times a day (QID) | ORAL | Status: DC
  Administered 2019-01-19 – 2019-01-21 (×7): 800 mg via ORAL
  Filled 2019-01-18 (×8): qty 1

## 2019-01-18 MED ORDER — CEFAZOLIN SODIUM-DEXTROSE 2-4 GM/100ML-% IV SOLN
2.0000 g | INTRAVENOUS | Status: AC
  Administered 2019-01-18: 16:00:00 2 g via INTRAVENOUS

## 2019-01-18 MED ORDER — DIPHENHYDRAMINE HCL 50 MG/ML IJ SOLN
12.5000 mg | INTRAMUSCULAR | Status: DC | PRN
  Administered 2019-01-18: 18:00:00 12.5 mg via INTRAVENOUS

## 2019-01-18 MED ORDER — ZOLPIDEM TARTRATE 5 MG PO TABS: 5.0000 mg | ORAL_TABLET | Freq: Every evening | ORAL | Status: DC | PRN

## 2019-01-18 MED ORDER — NALOXONE HCL 4 MG/10ML IJ SOLN
1.0000 ug/kg/h | INTRAVENOUS | Status: DC | PRN
  Filled 2019-01-18: qty 5

## 2019-01-18 MED ORDER — SIMETHICONE 80 MG PO CHEW: 80.0000 mg | CHEWABLE_TABLET | ORAL | Status: DC | PRN

## 2019-01-18 MED ORDER — HYDROCODONE-ACETAMINOPHEN 5-325 MG PO TABS
1.0000 | ORAL_TABLET | ORAL | Status: DC | PRN
  Administered 2019-01-19 – 2019-01-20 (×2): 1 via ORAL
  Administered 2019-01-20 – 2019-01-21 (×6): 2 via ORAL
  Filled 2019-01-18 (×2): qty 2
  Filled 2019-01-18: qty 1
  Filled 2019-01-18 (×3): qty 2
  Filled 2019-01-18: qty 1
  Filled 2019-01-18: qty 2

## 2019-01-18 MED ORDER — DEXAMETHASONE SODIUM PHOSPHATE 4 MG/ML IJ SOLN
INTRAMUSCULAR | Status: AC
  Filled 2019-01-18: qty 1

## 2019-01-18 MED ORDER — PROMETHAZINE HCL 25 MG/ML IJ SOLN: 6.2500 mg | INTRAMUSCULAR | Status: DC | PRN

## 2019-01-18 MED ORDER — SENNOSIDES-DOCUSATE SODIUM 8.6-50 MG PO TABS
2.0000 | ORAL_TABLET | ORAL | Status: DC
  Administered 2019-01-18 – 2019-01-21 (×3): 2 via ORAL
  Filled 2019-01-18 (×3): qty 2

## 2019-01-18 MED ORDER — PHENYLEPHRINE HCL-NACL 20-0.9 MG/250ML-% IV SOLN
INTRAVENOUS | Status: DC | PRN
  Administered 2019-01-18: 60 ug/min via INTRAVENOUS

## 2019-01-18 MED ORDER — DIPHENHYDRAMINE HCL 25 MG PO CAPS
25.0000 mg | ORAL_CAPSULE | ORAL | Status: DC | PRN
  Filled 2019-01-18: qty 1

## 2019-01-18 MED ORDER — BUPIVACAINE HCL (PF) 0.25 % IJ SOLN
INTRAMUSCULAR | Status: DC | PRN
  Administered 2019-01-18: 20 mL

## 2019-01-18 MED ORDER — KETOROLAC TROMETHAMINE 30 MG/ML IJ SOLN
INTRAMUSCULAR | Status: AC
  Filled 2019-01-18: qty 1

## 2019-01-18 MED ORDER — TETANUS-DIPHTH-ACELL PERTUSSIS 5-2.5-18.5 LF-MCG/0.5 IM SUSP: 0.5000 mL | Freq: Once | INTRAMUSCULAR | Status: DC

## 2019-01-18 MED ORDER — METHYLERGONOVINE MALEATE 0.2 MG/ML IJ SOLN: 0.2000 mg | INTRAMUSCULAR | Status: DC | PRN

## 2019-01-18 MED ORDER — OXYTOCIN 40 UNITS IN NORMAL SALINE INFUSION - SIMPLE MED
INTRAVENOUS | Status: DC | PRN
  Administered 2019-01-18: 40 mL via INTRAVENOUS

## 2019-01-18 MED ORDER — HYDROMORPHONE HCL 1 MG/ML IJ SOLN: 0.2500 mg | INTRAMUSCULAR | Status: DC | PRN

## 2019-01-18 MED ORDER — KETOROLAC TROMETHAMINE 30 MG/ML IJ SOLN
30.0000 mg | Freq: Four times a day (QID) | INTRAMUSCULAR | Status: AC
  Administered 2019-01-18 – 2019-01-19 (×3): 30 mg via INTRAVENOUS
  Filled 2019-01-18 (×3): qty 1

## 2019-01-18 MED ORDER — MORPHINE SULFATE (PF) 0.5 MG/ML IJ SOLN
INTRAMUSCULAR | Status: AC
  Filled 2019-01-18: qty 10

## 2019-01-18 MED ORDER — WITCH HAZEL-GLYCERIN EX PADS: 1.0000 "application " | MEDICATED_PAD | CUTANEOUS | Status: DC | PRN

## 2019-01-18 MED ORDER — ACETAMINOPHEN 500 MG PO TABS
1000.0000 mg | ORAL_TABLET | Freq: Four times a day (QID) | ORAL | Status: AC
  Administered 2019-01-18 – 2019-01-19 (×3): 1000 mg via ORAL
  Filled 2019-01-18 (×3): qty 2

## 2019-01-18 MED ORDER — FENTANYL CITRATE (PF) 100 MCG/2ML IJ SOLN
INTRAMUSCULAR | Status: DC | PRN
  Administered 2019-01-18: 15 ug via INTRATHECAL

## 2019-01-18 MED ORDER — OXYTOCIN 40 UNITS IN NORMAL SALINE INFUSION - SIMPLE MED: 2.5000 [IU]/h | INTRAVENOUS | Status: AC

## 2019-01-18 MED ORDER — SCOPOLAMINE 1 MG/3DAYS TD PT72
MEDICATED_PATCH | TRANSDERMAL | Status: AC
  Filled 2019-01-18: qty 1

## 2019-01-18 MED ORDER — DEXAMETHASONE SODIUM PHOSPHATE 4 MG/ML IJ SOLN
INTRAMUSCULAR | Status: DC | PRN
  Administered 2019-01-18: 4 mg via INTRAVENOUS

## 2019-01-18 MED ORDER — KETOROLAC TROMETHAMINE 30 MG/ML IJ SOLN: 30.0000 mg | Freq: Four times a day (QID) | INTRAMUSCULAR | Status: AC | PRN

## 2019-01-18 MED ORDER — KETOROLAC TROMETHAMINE 30 MG/ML IJ SOLN
30.0000 mg | Freq: Four times a day (QID) | INTRAMUSCULAR | Status: AC | PRN
  Administered 2019-01-18: 18:00:00 30 mg via INTRAMUSCULAR

## 2019-01-18 MED ORDER — DIPHENHYDRAMINE HCL 25 MG PO CAPS: 25.0000 mg | ORAL_CAPSULE | Freq: Four times a day (QID) | ORAL | Status: DC | PRN

## 2019-01-18 MED ORDER — ONDANSETRON HCL 4 MG/2ML IJ SOLN: 4.0000 mg | Freq: Three times a day (TID) | INTRAMUSCULAR | Status: DC | PRN

## 2019-01-18 MED ORDER — OXYCODONE HCL 5 MG PO TABS: 5.0000 mg | ORAL_TABLET | Freq: Once | ORAL | Status: DC | PRN

## 2019-01-18 MED ORDER — COCONUT OIL OIL: 1.0000 "application " | TOPICAL_OIL | Status: DC | PRN

## 2019-01-18 SURGICAL SUPPLY — 37 items
APL SKNCLS STERI-STRIP NONHPOA (GAUZE/BANDAGES/DRESSINGS) ×1
BENZOIN TINCTURE PRP APPL 2/3 (GAUZE/BANDAGES/DRESSINGS) ×1 IMPLANT
CHLORAPREP W/TINT 26ML (MISCELLANEOUS) ×2 IMPLANT
CLAMP CORD UMBIL (MISCELLANEOUS) IMPLANT
CLOTH BEACON ORANGE TIMEOUT ST (SAFETY) ×2 IMPLANT
DRSG OPSITE POSTOP 4X10 (GAUZE/BANDAGES/DRESSINGS) ×2 IMPLANT
ELECT REM PT RETURN 9FT ADLT (ELECTROSURGICAL) ×2
ELECTRODE REM PT RTRN 9FT ADLT (ELECTROSURGICAL) ×1 IMPLANT
EXTRACTOR VACUUM M CUP 4 TUBE (SUCTIONS) IMPLANT
GLOVE BIO SURGEON STRL SZ7.5 (GLOVE) ×2 IMPLANT
GLOVE BIOGEL PI IND STRL 7.0 (GLOVE) ×1 IMPLANT
GLOVE BIOGEL PI INDICATOR 7.0 (GLOVE) ×1
GOWN STRL REUS W/TWL LRG LVL3 (GOWN DISPOSABLE) ×4 IMPLANT
KIT ABG SYR 3ML LUER SLIP (SYRINGE) IMPLANT
NDL HYPO 25X5/8 SAFETYGLIDE (NEEDLE) IMPLANT
NDL SPNL 20GX3.5 QUINCKE YW (NEEDLE) IMPLANT
NEEDLE HYPO 22GX1.5 SAFETY (NEEDLE) ×2 IMPLANT
NEEDLE HYPO 25X5/8 SAFETYGLIDE (NEEDLE) IMPLANT
NEEDLE SPNL 20GX3.5 QUINCKE YW (NEEDLE) IMPLANT
NS IRRIG 1000ML POUR BTL (IV SOLUTION) ×2 IMPLANT
PACK C SECTION WH (CUSTOM PROCEDURE TRAY) ×2 IMPLANT
PENCIL SMOKE EVAC W/HOLSTER (ELECTROSURGICAL) ×2 IMPLANT
STRIP CLOSURE SKIN 1/2X4 (GAUZE/BANDAGES/DRESSINGS) ×1 IMPLANT
SUT MNCRL 0 VIOLET CTX 36 (SUTURE) ×2 IMPLANT
SUT MNCRL AB 3-0 PS2 27 (SUTURE) IMPLANT
SUT MON AB 2-0 CT1 27 (SUTURE) ×2 IMPLANT
SUT MON AB-0 CT1 36 (SUTURE) ×4 IMPLANT
SUT MONOCRYL 0 CTX 36 (SUTURE) ×2
SUT PLAIN 0 NONE (SUTURE) IMPLANT
SUT PLAIN 2 0 (SUTURE)
SUT PLAIN 2 0 XLH (SUTURE) IMPLANT
SUT PLAIN ABS 2-0 CT1 27XMFL (SUTURE) IMPLANT
SYR 20CC LL (SYRINGE) IMPLANT
SYR CONTROL 10ML LL (SYRINGE) ×2 IMPLANT
TOWEL OR 17X24 6PK STRL BLUE (TOWEL DISPOSABLE) ×2 IMPLANT
TRAY FOLEY W/BAG SLVR 14FR LF (SET/KITS/TRAYS/PACK) ×2 IMPLANT
WATER STERILE IRR 1000ML POUR (IV SOLUTION) ×2 IMPLANT

## 2019-01-18 NOTE — Transfer of Care (Signed)
Immediate Anesthesia Transfer of Care Note  Patient: Taylor Carey  Procedure(s) Performed: Primary CESAREAN SECTION (N/A )  Patient Location: PACU  Anesthesia Type:spinal  Level of Consciousness: awake, alert  and oriented  Airway & Oxygen Therapy: Patient Spontanous Breathing  Post-op Assessment: Report given to RN and Post -op Vital signs reviewed and stable  Post vital signs: Reviewed and stable  Last Vitals:  Vitals Value Taken Time  BP 106/63 01/18/19 1711  Temp    Pulse 87 01/18/19 1712  Resp 20 01/18/19 1712  SpO2 100 % 01/18/19 1712  Vitals shown include unvalidated device data.  Last Pain:  Vitals:   01/18/19 1515  TempSrc: Oral         Complications: No apparent anesthesia complications

## 2019-01-18 NOTE — Op Note (Signed)
Cesarean Section Procedure Note  Indications: malpresentation: frank breech and oligohydramnios  Pre-operative Diagnosis: 39 week 0 day pregnancy.  Post-operative Diagnosis: same  Surgeon: Lovenia Kim   Assistants: Claudette Laws, CNM  Anesthesia: Local anesthesia 0.25.% bupivacaine and Spinal anesthesia  ASA Class: 2  Procedure Details  The patient was seen in the Holding Room. The risks, benefits, complications, treatment options, and expected outcomes were discussed with the patient.  The patient concurred with the proposed plan, giving informed consent. The risks of anesthesia, infection, bleeding and possible injury to other organs discussed. Injury to bowel, bladder, or ureter with possible need for repair discussed. Possible need for transfusion with secondary risks of hepatitis or HIV acquisition discussed. Post operative complications to include but not limited to DVT, PE and Pneumonia noted. The site of surgery properly noted/marked. The patient was taken to Operating Room # A, identified as Taylor Carey and the procedure verified as C-Section Delivery. A Time Out was held and the above information confirmed.  After induction of anesthesia, the patient was draped and prepped in the usual sterile manner. A Pfannenstiel incision was made and carried down through the subcutaneous tissue to the fascia. Fascial incision was made and extended transversely using Mayo scissors. The fascia was separated from the underlying rectus tissue superiorly and inferiorly. The peritoneum was identified and entered. Peritoneal incision was extended longitudinally. The utero-vesical peritoneal reflection was incised transversely and the bladder flap was bluntly freed from the lower uterine segment. A low transverse uterine incision(Kerr hysterotomy) was made. Delivered from frank breech presentation was a  female with Apgar scores of 8 at one minute and 9 at five minutes. Bulb suctioning gently performed.  Neonatal team in attendance.After the umbilical cord was clamped and cut cord blood was obtained for evaluation. The placenta was removed intact and appeared normal. The uterus was curetted with a dry lap pack. Good hemostasis was noted.The uterine outline, tubes and ovaries appeared normal. The uterine incision was closed with running locked sutures of 0 Monocryl x 2 layers. Hemostasis was observed. Lavage was carried out until clear.The parietal peritoneum was closed with a running 2-0 Monocryl suture. The fascia was then reapproximated with running sutures of 0 Monocryl. The skin was reapproximated with 3-0 monocryl after [place] closure with 2-0 plain.  Instrument, sponge, and needle counts were correct prior the abdominal closure and at the conclusion of the case.   Findings: As noted   Estimated Blood Loss:  500         Drains: foley                 Specimens: placenta                 Complications:  None; patient tolerated the procedure well.         Disposition: PACU - hemodynamically stable.         Condition: stable  Attending Attestation: I performed the procedure.

## 2019-01-18 NOTE — Anesthesia Postprocedure Evaluation (Signed)
Anesthesia Post Note  Patient: [place]  Procedure(s) Performed: Primary CESAREAN SECTION (N/A )     Patient location during evaluation: PACU Anesthesia Type: Spinal Level of consciousness: oriented and awake and alert Pain management: pain level controlled Vital Signs Assessment: post-procedure vital signs reviewed and stable Respiratory status: spontaneous breathing and respiratory function stable Cardiovascular status: blood pressure returned to baseline and stable Postop Assessment: no headache, no backache, no apparent nausea or vomiting and patient able to bend at knees Anesthetic complications: no    Last Vitals:  Vitals:   01/18/19 1730 01/18/19 1745  BP: 110/72   Pulse: 97 87  Resp: 19 17  Temp:    SpO2: 100% 99%    Last Pain:  Vitals:   01/18/19 1715  TempSrc: Oral   Pain Goal:    LLE Motor Response: No movement due to regional block (01/18/19 1715) LLE Sensation: Tingling (01/18/19 1715) RLE Motor Response: No movement due to regional block (01/18/19 1715) RLE Sensation: Tingling (01/18/19 1715)     Epidural/Spinal Function Cutaneous sensation: Tingles (01/18/19 1715), Patient able to flex knees: No (01/18/19 1715), Patient able to lift hips off bed: No (01/18/19 1715), Back pain beyond tenderness at insertion site: No (01/18/19 1715), Progressively worsening motor and/or sensory loss: No (01/18/19 1715), Bowel and/or bladder incontinence post epidural: No (01/18/19 1715)  Pervis Hocking

## 2019-01-18 NOTE — Anesthesia Procedure Notes (Signed)
Spinal  Patient location during procedure: OR Start time: 01/18/2019 4:10 PM End time: 01/18/2019 4:16 PM Staffing Performed: anesthesiologist  Anesthesiologist: Pervis Hocking, DO Preanesthetic Checklist Completed: patient identified, IV checked, risks and benefits discussed, surgical consent, monitors and equipment checked, pre-op evaluation and timeout performed Spinal Block Patient position: sitting Prep: DuraPrep and site prepped and draped Patient monitoring: cardiac monitor, continuous pulse ox and blood pressure Approach: midline Location: L3-4 Injection technique: single-shot Needle Needle type: Pencan  Needle gauge: 24 G Needle length: 9 cm Assessment Sensory level: T6 Additional Notes Functioning IV was confirmed and monitors were applied. Sterile prep and drape, including hand hygiene and sterile gloves were used. The patient was positioned and the spine was prepped. The skin was anesthetized with lidocaine.  Free flow of clear CSF was obtained prior to injecting local anesthetic into the CSF.  The spinal needle aspirated freely following injection.  The needle was carefully withdrawn.  The patient tolerated the procedure well.

## 2019-01-18 NOTE — H&P (Signed)
Taylor Carey is a 33 y.o. female presenting for primary csection. OB History    Gravida  3   Para      Term      Preterm      AB  2   Living        SAB  2   TAB      Ectopic      Multiple      Live Births             Past Medical History:  Diagnosis Date  . Abnormal Pap smear of cervix 05/29/13   ASCUS +HPV HR  . GERD (gastroesophageal reflux disease)   . Insomnia   . Kidney stone    Past Surgical History:  Procedure Laterality Date  . TONSILLECTOMY     Family History: family history includes Alzheimer's disease in her maternal grandfather; Cancer in her mother; Crohn's disease in her mother; Glaucoma in her mother; Hypertension in her mother; Kidney cancer in her father; Kidney disease in her mother; Parkinson's disease in her paternal grandfather. Social History:  reports that she has never smoked. She has never used smokeless tobacco. She reports previous alcohol use. She reports that she does not use drugs.     Maternal Diabetes: No Genetic Screening: Normal Maternal Ultrasounds/Referrals: Normal Fetal Ultrasounds or other Referrals:  None Maternal Substance Abuse:  No Significant Maternal Medications:  None Significant Maternal Lab Results:  Group B Strep negative Other Comments:  None  Review of Systems  Constitutional: Negative.   All other systems reviewed and are negative.  Maternal Medical History:  Fetal activity: Perceived fetal activity is normal.   Last perceived fetal movement was within the past 12 hours.    Prenatal complications: Oligohydramnios.   Prenatal Complications - Diabetes: none.      Last menstrual period 04/20/2018. Maternal Exam:  Uterine Assessment: Contraction strength is mild.  Contraction frequency is rare.   Abdomen: Patient reports no abdominal tenderness. Fetal presentation: breech  Introitus: Normal vulva. Normal vagina.  Ferning test: not done.  Nitrazine test: not done. Amniotic fluid character:  not assessed.  Pelvis: questionable for delivery.   Cervix: Cervix evaluated by digital exam.     Physical Exam  Nursing note and vitals reviewed. Constitutional: She is oriented to person, place, and time. She appears well-developed and well-nourished.  HENT:  Head: Normocephalic and atraumatic.  Cardiovascular: Normal rate and regular rhythm.  Respiratory: Effort normal.  GI: Soft. Bowel sounds are normal.  Genitourinary:    Vulva, vagina and uterus normal.   Musculoskeletal:        General: Normal range of motion.     Cervical back: Normal range of motion and neck supple.  Neurological: She is alert and oriented to person, place, and time. She has normal reflexes.  Skin: Skin is warm and dry.  Psychiatric: She has a normal mood and affect.    Prenatal labs: ABO, Rh: --/--/B POS, B POS Performed at [facility], [place] [address] [place], [place] [address]  [phone]) Antibody: NEG (12/29 0946) Rubella: Immune (06/05 0000) RPR: NON REACTIVE (12/29 0950)  HBsAg: Negative (06/05 0000)  HIV: Non-reactive (06/05 0000)  GBS:   neg  Assessment/Plan: 39wk IUP Breech Oligohydramnios Primary csection. Non candidate for ECV. Surgical consent done.Risks of surgery discussed.   Nadja Lina J 01/18/2019, 8:40 AM

## 2019-01-19 ENCOUNTER — Encounter (HOSPITAL_COMMUNITY): Payer: Self-pay | Admitting: Obstetrics and Gynecology

## 2019-01-19 DIAGNOSIS — O9902 Anemia complicating childbirth: Secondary | ICD-10-CM

## 2019-01-19 HISTORY — DX: Anemia complicating childbirth: O99.02

## 2019-01-19 LAB — CBC
HCT: 28.4 % — ABNORMAL LOW (ref 36.0–46.0)
Hemoglobin: 8.9 g/dL — ABNORMAL LOW (ref 12.0–15.0)
MCH: 25.6 pg — ABNORMAL LOW (ref 26.0–34.0)
MCHC: 31.3 g/dL (ref 30.0–36.0)
MCV: 81.8 fL (ref 80.0–100.0)
Platelets: 235 10*3/uL (ref 150–400)
RBC: 3.47 MIL/uL — ABNORMAL LOW (ref 3.87–5.11)
RDW: 14.6 % (ref 11.5–15.5)
WBC: 14.2 10*3/uL — ABNORMAL HIGH (ref 4.0–10.5)
nRBC: 0 % (ref 0.0–0.2)

## 2019-01-19 MED ORDER — POLYSACCHARIDE IRON COMPLEX 150 MG PO CAPS
150.0000 mg | ORAL_CAPSULE | Freq: Every day | ORAL | Status: DC
  Administered 2019-01-20 – 2019-01-21 (×2): 150 mg via ORAL
  Filled 2019-01-19 (×2): qty 1

## 2019-01-19 MED ORDER — MAGNESIUM OXIDE 400 (241.3 MG) MG PO TABS
400.0000 mg | ORAL_TABLET | Freq: Every day | ORAL | Status: DC
  Administered 2019-01-20 – 2019-01-21 (×2): 400 mg via ORAL
  Filled 2019-01-19 (×2): qty 1

## 2019-01-19 NOTE — Progress Notes (Signed)
When this nurse entered patients room, patient became teary, states she is crying because of the pain and pain with movement, and crying because of the challenges with breastfeeding. Discussed pain  interventions and a plan to manage pain. Encouraged to call for assistance with next feeding, reassured patient and discussed breastfeeding resources.   Tylene Fantasia, RN

## 2019-01-19 NOTE — Progress Notes (Signed)
Patient ID: Taylor Carey, female   DOB: Nov 27, 1985, 34 y.o.   MRN: [id number] Subjective: POD# 1 Live born female  Birth Weight: 7 lb 12.5 oz (3530 g) APGAR: 9, 10  Newborn Delivery   Birth date/time: 01/18/2019 16:34:00 Delivery type: C-Section, Low Transverse Trial of labor: No C-section categorization: Primary     Baby name: Art Buff Delivering provider: Olivia Mackie  Feeding: breast  Pain control at delivery: Spinal   Reports feeling well  Patient reports tolerating PO.   Breast symptoms: + colostrum Pain controlled with PO meds Denies HA/SOB/C/P/N/V/dizziness. Flatus present. She reports vaginal bleeding as normal, without clots.  She has ambulated to BR x 1, foley still in place.     Objective:   VS:    Vitals:   01/18/19 2245 01/19/19 0145 01/19/19 0530 01/19/19 0945  BP:  (!) 100/52 (!) 100/58 95/64  Pulse:  (!) 59 64 62  Resp: 18 18 18 18   Temp: 98.4 F (36.9 C) 98.2 F (36.8 C) 97.8 F (36.6 C) 98.2 F (36.8 C)  TempSrc: Oral Oral Oral Axillary  SpO2: 97% 100% 98% 97%  Weight:      Height:          Intake/Output Summary (Last 24 hours) at 01/19/2019 1009 Last data filed at 01/19/2019 0530 Gross per 24 hour  Intake 1698.32 ml  Output 766 ml  Net 932.32 ml        Recent Labs    01/19/19 0415  WBC 14.2*  HGB 8.9*  HCT 28.4*  PLT 235     Blood type: --/--/B POS, B POS Performed at [facility], [address], [place], [place]  [phone] (56/43)  Rubella: Immune (06/05 0000)     Physical Exam:  General: alert, cooperative and no distress CV: Regular rate and rhythm Resp: clear Abdomen: soft, nontender, normal bowel sounds Incision: clean, dry and intact Uterine Fundus: firm, below umbilicus, nontender Lochia: minimal Ext: +1 pitting edema in LE's, no cords or tenderness      Assessment/Plan: 34 y.o.   POD# 1. 35                  Principal Problem:   Postpartum care following cesarean delivery  (12/31) Active Problems:   Breech presentation   S/P cesarean section   Maternal anemia, with delivery  - started oral Fe and Mag Ox  Doing well, stable.               Advance diet as tolerated Encourage rest when baby rests Breastfeeding support Encourage to ambulate when Foley DCed Routine post-op care  12-31-1979, CNM, MSN 01/19/2019, 10:09 AM

## 2019-01-19 NOTE — Lactation Note (Signed)
This note was copied from a baby's chart. Lactation Consultation Note  Patient Name: Girl Cailey Trigueros [id number] Date: 01/19/2019 Reason for consult: Initial assessment  Initial visit at 21 hours of life. Mom is a P1 who reports + breast changes w/pregnancy. Mom is large-breasted with small-diameter nipples that are flat. Mom had been provided a size 16 nipple shield by a previous Charity fundraiser. There has been limited success with breastfeeding, although current RN Louis Matte) reports that infant has latched twice without the nipple shield, but there was questionable transfer.  Mom's amenable to trying supplementing at the breast (if infant permits) & use a size 20 nipple shield, if needed. Size 21 flanges were provided & applied to her pump.  We were going to get infant to breast, but I noted that infant was swallowing frequently while laying in bassinet (regurgitating). I picked infant up and observed the resolution of the regurgitation & then placed her skin-to-skin on Mom. I asked Mom to call for me when infant cues to feed. Infant has received formula via spoon (x 2).   Hand expression was taught to Mom, but nothing was yielded from the R side & only a glisten was noted on the L side.   Note: Mom has a Medela pump at home.    Lurline Hare [facility] 01/19/2019, 1:35 PM

## 2019-01-19 NOTE — Lactation Note (Signed)
This note was copied from a baby's chart. Lactation Consultation Note  Patient Name: Taylor Carey Today's Date: 01/19/2019   A considerable amount of time was spent with this family. On the R breast, infant was able to latch to the breast without a nipple shield & swallows were noted (swallows verified by cervical auscultation). However, Mom felt some discomfort that could not be fixed by other latch adjustments so a nipple shield was added. With breast compressions, multiple swallows were noted. Infant came off breast, temporarily content.  Once infant was ready to go to the next breast, she became so fussy (at bare breast & with a nipple shield) that she would not latch. I taught Dad how to finger-feed, which worked quite well.   Other time was spent with the parents trying to assist her to latch again (with & without the nipple shield & attempting to supplement at breast to improve her ability on the L breast).   Plan: 1. Offer breast. 2. If that doesn't work, Dad can finger-feed 3. Pump, as able.   I explained to Dad how to assist in latching with the teacup hold. Dad was able to return demonstrate how to clean 5 Fr & syringe.   Lurline Hare [facility] 01/19/2019, 6:55 PM

## 2019-01-20 MED ORDER — CEFAZOLIN SODIUM-DEXTROSE 2-4 GM/100ML-% IV SOLN
INTRAVENOUS | Status: AC
  Filled 2019-01-20: qty 100

## 2019-01-20 NOTE — Lactation Note (Signed)
This note was copied from a baby's chart. Lactation Consultation Note  Patient Name: Taylor Carey [id number] Date: 01/20/2019 Reason for consult: Follow-up assessment;Difficult latch Mom called out for latch assist.  Baby is crying.  Baby only took 6 mls at last feeding.  Assisted with positioning baby in football hold on right.  Nipples flat and tea cup hold needed for baby to grasp tissue.  After a few attempts baby latched well.  Mom uncomfortable initially.  5 french feeding tube inserted into side of baby's mouth.  Baby took 4 mls and fell asleep.  Parents would like to try a bottle.  Paced feeding taught and baby did well.  Instructed to burp often and give up to 30 mls.  Questions answered.  Breasts shells given and assisted with putting on.  Instructed to call for assist prn.  Maternal Data    Feeding Feeding Type: Breast Fed  LATCH Score Latch: Grasps breast easily, tongue down, lips flanged, rhythmical sucking.  Audible Swallowing: A few with stimulation  Type of Nipple: Flat  Comfort (Breast/Nipple): Soft / non-tender  Hold (Positioning): Assistance needed to correctly position infant at breast and maintain latch.  LATCH Score: 7  Interventions    Lactation Tools Discussed/Used Tools: 78F feeding tube / Syringe Shell Type: Inverted Breast pump type: Double-Electric Breast Pump   Consult Status Consult Status: Follow-up Date: 01/21/19 Follow-up type: In-patient    Huston Foley 01/20/2019, 3:08 PM

## 2019-01-20 NOTE — Lactation Note (Signed)
This note was copied from a baby's chart. Lactation Consultation Note  Patient Name: Taylor Carey [id number] Date: 01/20/2019 Reason for consult: Follow-up assessment;Difficult latch Baby is 43 hours old/6% weight loss.  Baby continues to have a difficult time latching.  FOB is finger feeding 10-20 mls of formula.  Feeding takes approximately 15 minutes.  Recommended feeding the baby 20-30 mls every 3 hours.  Discussed finger feeding is for short term use.  As volume needed increases it will more feasible to use a bottle.  Mom understands.  Mom is pumping every 3 hours.  Baby was recently fed.  Instructed mom to call for [facility] latch assist with next feeding.  Maternal Data    Feeding    LATCH Score                   Interventions    Lactation Tools Discussed/Used     Consult Status Consult Status: Follow-up Date: 01/20/19 Follow-up type: In-patient    Taylor Carey 01/20/2019, 12:16 PM

## 2019-01-20 NOTE — Progress Notes (Signed)
POD#2  S: Pt notes pain controlled w/ po meds, though requiring continuous medication.  Minimal lochia, nl void, out of bed w/o dizziness or chest pain, tol reg po, + flatus. Pt is  Breastfeeding.  Patient requesting abdominal binder as most painful to get in and out of bed.  Patient does not feel like she is ready for discharge today.  Vitals:   01/19/19 1100 01/19/19 1500 01/19/19 2214 01/20/19 0518  BP: (!) 104/59 118/69 (!) 115/54 111/71  Pulse: 78 66 71 62  Resp: 18 18 19 18   Temp: 98.1 F (36.7 C) 98.6 F (37 C) 98.2 F (36.8 C) 97.9 F (36.6 C)  TempSrc: Axillary Axillary Oral Oral  SpO2: 98% 98%  98%  Weight:      Height:        Gen: well appearing CV: RRR Pulm: CTAB Abd: soft, ND, approp tender, fundus below umbilicus, NT Inc: C/D/I,  LE: tr edema, NT  CBC    Component Value Date/Time   WBC 14.2 (H) 01/19/2019 0415   RBC 3.47 (L) 01/19/2019 0415   HGB 8.9 (L) 01/19/2019 0415   HGB 13.8 09/08/2016 1319   HCT 28.4 (L) 01/19/2019 0415   HCT 39.7 09/08/2016 1319   PLT 235 01/19/2019 0415   PLT 276 09/08/2016 1319   MCV 81.8 01/19/2019 0415   MCV 82 09/08/2016 1319   MCH 25.6 (L) 01/19/2019 0415   MCHC 31.3 01/19/2019 0415   RDW 14.6 01/19/2019 0415   RDW 13.6 09/08/2016 1319   LYMPHSABS 2.6 07/05/2017 0839   MONOABS 0.6 07/05/2017 0839   EOSABS 0.2 07/05/2017 0839   BASOSABS 0.1 07/05/2017 0839    A/P: POD#2 s/p primary cesarean section for breech with oligohydramnios - post-op. Doing well.  Continue inpatient care today.  Discharge home tomorrow -Anemia.  On iron and magnesium  07/07/2017 01/20/2019 11:00 AM

## 2019-01-21 MED ORDER — IBUPROFEN 800 MG PO TABS: 800.0000 mg | ORAL_TABLET | Freq: Three times a day (TID) | ORAL | 1 refills | Status: AC | PRN

## 2019-01-21 MED ORDER — HYDROCODONE-ACETAMINOPHEN 5-325 MG PO TABS: 1.0000 | ORAL_TABLET | ORAL | 0 refills | Status: AC | PRN

## 2019-01-21 NOTE — Discharge Summary (Signed)
OB Discharge Summary  Patient Name: Taylor Carey DOB: 11/01/1985 MRN: [id number]  Date of admission: 01/18/2019 Delivering MD: Olivia Mackie   Date of discharge: 01/21/2019  Admitting diagnosis: Breech presentation [O32.1XX0] Intrauterine pregnancy: [redacted]w[redacted]d     Secondary diagnosis:Principal Problem:   Postpartum care following cesarean delivery (12/31) Active Problems:   Breech presentation   S/P cesarean section   Maternal anemia, with delivery  Additional problems: None     Discharge diagnosis: Term Pregnancy Delivered                                                                     Post partum procedures:None  Augmentation: None  Complications: None  Hospital course:  Sceduled C/S   34 y.o. yo [id number] at [redacted]w[redacted]d was admitted to the hospital 01/18/2019 for scheduled cesarean section with the following indication:Malpresentation.  Membrane Rupture Time/Date: 4:34 PM ,01/18/2019   Patient delivered a Viable infant.01/18/2019  Details of operation can be found in separate operative note.  Pateint had an uncomplicated postpartum course.  She is ambulating, tolerating a regular diet, passing flatus, and urinating well. Patient is discharged home in stable condition on  01/21/19     On day of discharge patient notes minimal lochia, positive ambulation, positive void, positive flatus.  Still waiting on bowel movement but tolerating regular p.o.  Pain is controlled with p.o. medications however patient remains dependent on narcotic medications.  Patient notes most painful getting in and out of the bed.  Patient expresses readiness to discharge home.      Physical exam  Vitals:   01/20/19 0518 01/20/19 1517 01/20/19 2311 01/21/19 0500  BP: 111/71 122/65 126/62 113/70  Pulse: 62 73 73 69  Resp: 18 18 18 16   Temp: 97.9 F (36.6 C) 98.2 F (36.8 C) 98.7 F (37.1 C) 98.2 F (36.8 C)  TempSrc: Oral Oral Oral   SpO2: 98% 99%    Weight:      Height:       General:  alert, cooperative and no distress Lochia: appropriate Uterine Fundus: firm Incision: Healing well with no significant drainage DVT Evaluation: No evidence of DVT seen on physical exam. Labs: Lab Results  Component Value Date   WBC 14.2 (H) 01/19/2019   HGB 8.9 (L) 01/19/2019   HCT 28.4 (L) 01/19/2019   MCV 81.8 01/19/2019   PLT 235 01/19/2019   CMP Latest Ref Rng & Units 07/05/2017  Glucose 70 - 99 mg/dL 89  BUN 6 - 23 mg/dL 15  Creatinine 07/07/2017 - 1.31 mg/dL 4.38  Sodium 8.87 - 579 mEq/L 139  Potassium 3.5 - 5.1 mEq/L 4.1  Chloride 96 - 112 mEq/L 105  CO2 19 - 32 mEq/L 27  Calcium 8.4 - 10.5 mg/dL 9.8  Total Protein 6.0 - 8.3 g/dL 7.0  Total Bilirubin 0.2 - 1.2 mg/dL 0.3  Alkaline Phos 39 - 117 U/L 94  AST 0 - 37 U/L 13  ALT 0 - 35 U/L 17    Discharge instruction: per After Visit Summary and "Baby and Me Booklet".  After Visit Meds:  Allergies as of 01/21/2019      Reactions   Bactrim [sulfamethoxazole-trimethoprim] Nausea Only   Gardasil [hpv 4-valent Vaccine Recombinant Vaccine]  questionable   Macrobid [nitrofurantoin]    Chest tightness      Medication List    TAKE these medications   Fish Oil 1000 MG Caps   HYDROcodone-acetaminophen 5-325 MG tablet Commonly known as: NORCO/VICODIN Take 1-2 tablets by mouth every 4 (four) hours as needed for moderate pain.   hydrOXYzine 10 MG tablet Commonly known as: ATARAX/VISTARIL Take 10 mg by mouth 3 (three) times daily as needed.   ibuprofen 800 MG tablet Commonly known as: ADVIL Take 1 tablet (800 mg total) by mouth every 8 (eight) hours as needed for up to 60 doses.   Magnesium 500 MG Tabs   omeprazole 20 MG tablet Commonly known as: PRILOSEC OTC Take 20 mg by mouth daily.   prenatal multivitamin Tabs tablet Take 1 tablet by mouth daily at 12 noon.       Diet: routine diet  Activity: Advance as tolerated. Pelvic rest for 6 weeks.   Outpatient follow up:6 weeks Follow up Appt:No future  appointments. Follow up visit: No follow-ups on file.  Postpartum contraception: Not Discussed  Newborn Data: Live born female  Birth Weight: 7 lb 12.5 oz (3530 g) APGAR: 67, 49  Newborn Delivery   Birth date/time: 01/18/2019 16:34:00 Delivery type: C-Section, Low Transverse Trial of labor: No C-section categorization: Primary      Baby Feeding: Breast Disposition:home with mother   01/21/2019 Ala Dach, MD

## 2019-01-21 NOTE — Lactation Note (Signed)
This note was copied from a baby's chart. Lactation Consultation Note  Patient Name: Taylor Carey [id number] Date: 01/21/2019 Reason for consult: Follow-up assessment;Difficult latch Baby is 65 hours old/5% weight loss.  Mom feels like they are making some progress with latching baby.  Baby is showing more interest and FOB is assisting with the tea cup hold.  Baby is formula feeding well. Baby just finished a bottle feeding.  Mom has not pumped since yesterday.  Stressed importance of pumping every 3 hours to establish and maintain her milk supply.  Mom feels breast shells are also helping.  Recommended making an outpatient appointment later next week if latch assist is needed.  Maternal Data    Feeding Feeding Type: Formula Nipple Type: Slow - flow  LATCH Score                   Interventions    Lactation Tools Discussed/Used     Consult Status Consult Status: Complete Follow-up type: Call as needed    Huston Foley 01/21/2019, 10:32 AM

## 2019-04-06 ENCOUNTER — Encounter: Payer: Self-pay | Admitting: Certified Nurse Midwife

## 2021-10-12 ENCOUNTER — Encounter: Payer: Self-pay | Admitting: *Deleted
